# Patient Record
Sex: Female | Born: 1945 | Race: White | Hispanic: No | Marital: Married | Smoking: Never smoker
Health system: Southern US, Community
[De-identification: ages and names within clinical notes are randomized; demographics above are authoritative.]

## PROBLEM LIST (undated history)

## (undated) DIAGNOSIS — I10 Essential (primary) hypertension: Secondary | ICD-10-CM

## (undated) DIAGNOSIS — N200 Calculus of kidney: Secondary | ICD-10-CM

## (undated) DIAGNOSIS — G5600 Carpal tunnel syndrome, unspecified upper limb: Secondary | ICD-10-CM

## (undated) DIAGNOSIS — E063 Autoimmune thyroiditis: Secondary | ICD-10-CM

## (undated) DIAGNOSIS — Z87442 Personal history of urinary calculi: Secondary | ICD-10-CM

## (undated) HISTORY — PX: CHOLECYSTECTOMY: SHX55

## (undated) HISTORY — DX: Essential (primary) hypertension: I10

## (undated) HISTORY — PX: TUBAL LIGATION: SHX77

## (undated) HISTORY — DX: Carpal tunnel syndrome, unspecified upper limb: G56.00

## (undated) HISTORY — PX: TONSILLECTOMY: SUR1361

## (undated) HISTORY — DX: Autoimmune thyroiditis: E06.3

---

## 2012-09-05 HISTORY — PX: OTHER SURGICAL HISTORY: SHX169

## 2013-10-11 ENCOUNTER — Encounter: Payer: Self-pay | Admitting: Emergency Medicine

## 2013-10-11 ENCOUNTER — Emergency Department (INDEPENDENT_AMBULATORY_CARE_PROVIDER_SITE_OTHER): Payer: Medicare Other

## 2013-10-11 ENCOUNTER — Emergency Department
Admission: EM | Admit: 2013-10-11 | Discharge: 2013-10-11 | Disposition: A | Discharge status: Home / Self Care | Payer: Medicare Other | Source: Home / Self Care | Attending: Emergency Medicine | Admitting: Emergency Medicine

## 2013-10-11 DIAGNOSIS — R05 Cough: Secondary | ICD-10-CM

## 2013-10-11 DIAGNOSIS — J209 Acute bronchitis, unspecified: Secondary | ICD-10-CM

## 2013-10-11 DIAGNOSIS — R0989 Other specified symptoms and signs involving the circulatory and respiratory systems: Secondary | ICD-10-CM

## 2013-10-11 DIAGNOSIS — J0100 Acute maxillary sinusitis, unspecified: Secondary | ICD-10-CM

## 2013-10-11 DIAGNOSIS — J45909 Unspecified asthma, uncomplicated: Secondary | ICD-10-CM

## 2013-10-11 DIAGNOSIS — R062 Wheezing: Secondary | ICD-10-CM

## 2013-10-11 HISTORY — DX: Calculus of kidney: N20.0

## 2013-10-11 MED ORDER — IPRATROPIUM-ALBUTEROL 0.5-2.5 (3) MG/3ML IN SOLN
3.0000 mL | Freq: Once | RESPIRATORY_TRACT | Status: AC
  Administered 2013-10-11: 3 mL via RESPIRATORY_TRACT

## 2013-10-11 MED ORDER — AMOXICILLIN-POT CLAVULANATE 875-125 MG PO TABS: 1.0000 | ORAL_TABLET | Freq: Two times a day (BID) | ORAL | Status: DC

## 2013-10-11 MED ORDER — BENZONATATE 100 MG PO CAPS: 100.0000 mg | ORAL_CAPSULE | Freq: Three times a day (TID) | ORAL | Status: DC

## 2013-10-11 MED ORDER — METHYLPREDNISOLONE ACETATE 80 MG/ML IJ SUSP
80.0000 mg | Freq: Once | INTRAMUSCULAR | Status: AC
  Administered 2013-10-11: 80 mg via INTRAMUSCULAR

## 2013-10-11 NOTE — ED Notes (Signed)
Gives history of congestion, cough, sore throat, hoarseness. No Flu vaccination this season.

## 2013-10-11 NOTE — ED Provider Notes (Signed)
CSN: [id number]     Arrival date & time 10/11/13  1033 History   First MD Initiated Contact with Patient 10/11/13 1039     Chief Complaint  Patient presents with  . Nasal Congestion  . Cough  . Hoarse   history of congestion, cough, sore throat, hoarseness. No Flu vaccination this season.  HPI .URI HISTORY/ROS  Nina Kirby is a 67 y.o. female who complains of onset of cold symptoms for 4 days. Progressively worsening.  Have been using over-the-counter treatment which isnt helping.  No chills/sweats +  Fever  +  Nasal congestion +  Discolored Post-nasal drainage No sinus pain/pressure No sore throat  +  cough + wheezing +No chest congestion No hemoptysis + mild shortness of breath + R pleuritic pain  No itchy/red eyes No earache  No nausea No vomiting No abdominal pain No diarrhea  No skin rashes +  Fatigue No myalgias + mild headache ,no focal neuro sx's  No PMH of asthma, but son has asthma.  Past Medical History  Diagnosis Date  . Renal calculi     3 days ago   Past Surgical History  Procedure Laterality Date  . Tonsillectomy    . Cholecystectomy    . Tubal ligation     History reviewed. No pertinent family history. History  Substance Use Topics  . Smoking status: Never Smoker   . Smokeless tobacco: Not on file  . Alcohol Use: No   OB History   Grav Para Term Preterm Abortions TAB SAB Ect Mult Living                 Review of Systems  Allergies  Codeine and Zithromax  Home Medications   Current Outpatient Rx  Name  Route  Sig  Dispense  Refill  . amoxicillin-clavulanate (AUGMENTIN) 875-125 MG per tablet   Oral   Take 1 tablet by mouth 2 (two) times daily. For 10 days. Take with food.   20 tablet   0   . benzonatate (TESSALON) 100 MG capsule   Oral   Take 1 capsule (100 mg total) by mouth every 8 (eight) hours. As needed for cough   21 capsule   0    BP 173/83  Pulse 81  Temp(Src) 98.2 F (36.8 C) (Oral)  Resp 18  Ht 5\' 7"   (1.702 m)  Wt 184 lb (83.462 kg)  BMI 28.81 kg/m2  SpO2 99% Physical Exam  Nursing note and vitals reviewed. Constitutional: She is oriented to person, place, and time. She appears well-developed and well-nourished.  Non-toxic appearance. She appears ill. No distress.  HENT:  Head: Normocephalic and atraumatic.  Right Ear: Tympanic membrane normal.  Left Ear: Tympanic membrane normal.  Nose: Mucosal edema and rhinorrhea present. Right sinus exhibits maxillary sinus tenderness. Left sinus exhibits maxillary sinus tenderness.  Mouth/Throat: Oropharynx is clear and moist. No oropharyngeal exudate.  Eyes: Right eye exhibits no discharge. Left eye exhibits no discharge. No scleral icterus.  Neck: Neck supple.  Cardiovascular: Normal rate, regular rhythm and normal heart sounds.   Pulmonary/Chest: No respiratory distress. She has wheezes. She has rhonchi. She has no rales.  Lymphadenopathy:    She has no cervical adenopathy.  Neurological: She is alert and oriented to person, place, and time.  Skin: Skin is warm and dry.    ED Course  Procedures (including critical care time) Labs Review Labs Reviewed - No data to display Imaging Review CHEST 2 VIEW  COMPARISON: None.  FINDINGS:  There  is no edema or consolidation. Heart size and pulmonary  vascularity are normal. No adenopathy. There is degenerative change  in the thoracic spine.  IMPRESSION:  No edema or consolidation.  Electronically Signed  By: Bretta Bang M.D.  On: 10/11/2013 12:14   EKG Interpretation    Date/Time:    Ventricular Rate:    PR Interval:    QRS Duration:   QT Interval:    QTC Calculation:   R Axis:     Text Interpretation:              MDM   1. Acute bronchitis   2. Asthmatic bronchitis   3. Acute maxillary sinusitis    CXR: NAD. No pneumonia or ptx. Treatment options discussed. Pt agrees with the following plan : DuoNeb nebulizer treatment given. Wheezing improved. Depomedrol 80  mg IM. I advised po prednisone. Pt refused. I advised rx for albuterol MDI. Pt refused.  AUGMENTIN 875-125 MG per tablet Take 1 tablet by mouth 2 (two) times daily. For 10 days. Take with food. Benzonatate (TESSALON) 100 MG capsule Take 1 capsule (100 mg total) by mouth every 8 (eight) hours. As needed for cough  Follow up with your primary care physician or specialist if not improving, having worsening of symptoms, or new severe symptoms.  Other symptomatic care discussed. Precautions discussed. Red flags discussed. Questions invited and answered. Patient voiced understanding and agreement.    Lajean Manes, MD 10/13/13 [phone]

## 2014-01-29 ENCOUNTER — Emergency Department
Admission: EM | Admit: 2014-01-29 | Discharge: 2014-01-29 | Disposition: A | Discharge status: Home / Self Care | Payer: Medicare Other | Source: Home / Self Care | Attending: Emergency Medicine | Admitting: Emergency Medicine

## 2014-01-29 ENCOUNTER — Encounter: Payer: Self-pay | Admitting: Emergency Medicine

## 2014-01-29 DIAGNOSIS — S139XXA Sprain of joints and ligaments of unspecified parts of neck, initial encounter: Secondary | ICD-10-CM

## 2014-01-29 DIAGNOSIS — S161XXA Strain of muscle, fascia and tendon at neck level, initial encounter: Secondary | ICD-10-CM

## 2014-01-29 MED ORDER — CYCLOBENZAPRINE HCL 5 MG PO TABS: 5.0000 mg | ORAL_TABLET | Freq: Three times a day (TID) | ORAL | Status: DC | PRN

## 2014-01-29 NOTE — ED Notes (Signed)
Rt shoulder pain radiates into neck, 9/10 sharp pain, before taking excedrin, now 0
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
## 2014-06-06 ENCOUNTER — Encounter: Payer: Self-pay | Admitting: Emergency Medicine

## 2014-06-06 ENCOUNTER — Emergency Department (INDEPENDENT_AMBULATORY_CARE_PROVIDER_SITE_OTHER)
Admission: EM | Admit: 2014-06-06 | Discharge: 2014-06-06 | Disposition: A | Discharge status: Home / Self Care | Payer: Medicare Other | Source: Home / Self Care | Attending: Emergency Medicine | Admitting: Emergency Medicine

## 2014-06-06 DIAGNOSIS — B029 Zoster without complications: Secondary | ICD-10-CM

## 2014-06-06 MED ORDER — VALACYCLOVIR HCL 1 G PO TABS: ORAL_TABLET | ORAL | Status: DC

## 2014-06-06 NOTE — ED Provider Notes (Signed)
CSN: [id number]     Arrival date & time 06/06/14  0906 History   First MD Initiated Contact with Patient 06/06/14 0914     Chief Complaint  Patient presents with  . Headache    L side of face and head    HPI 5 days ago, nonspecific sharp and dull left facial/forehead/frontal pain. 5/10 intensity. Ibuprofen helps. Then onset of rash with few blisters in this area. Does not cross midline. No other ENT symptoms. No visual symptoms. No fever or chills or nausea or vomiting or chest pain or shortness of breath. Denies any focal neurologic symptoms. Noted swollen left anterior neck lymph node 2 days ago, slightly sore. Past Medical History  Diagnosis Date  . Renal calculi     3 days ago   Past Surgical History  Procedure Laterality Date  . Tonsillectomy    . Cholecystectomy    . Tubal ligation     No family history on file. History  Substance Use Topics  . Smoking status: Never Smoker   . Smokeless tobacco: Not on file  . Alcohol Use: Yes   OB History   Grav Para Term Preterm Abortions TAB SAB Ect Mult Living                 Review of Systems  All other systems reviewed and are negative.   Allergies  Codeine and Zithromax  Home Medications   Prior to Admission medications   Medication Sig Start Date End Date Taking? Authorizing Provider  ibuprofen (ADVIL,MOTRIN) 200 MG tablet Take 200 mg by mouth every 6 (six) hours as needed.   Yes Historical Provider, MD  Multiple Vitamin (MULTIVITAMIN) tablet Take 1 tablet by mouth daily.   Yes Historical Provider, MD  amoxicillin-clavulanate (AUGMENTIN) 875-125 MG per tablet Take 1 tablet by mouth 2 (two) times daily. For 10 days. Take with food. 10/11/13   Lajean Manes, MD  aspirin-acetaminophen-caffeine (EXCEDRIN MIGRAINE) [phone] MG per tablet Take by mouth every 6 (six) hours as needed for headache.    Historical Provider, MD  benzonatate (TESSALON) 100 MG capsule Take 1 capsule (100 mg total) by mouth every 8 (eight) hours.  As needed for cough 10/11/13   Lajean Manes, MD  cyclobenzaprine (FLEXERIL) 5 MG tablet Take 1 tablet (5 mg total) by mouth 3 (three) times daily as needed for muscle spasms. 01/29/14   Marlaine Hind, MD  valACYclovir (VALTREX) 1000 MG tablet Take one by mouth 3 times a day for 7 days 06/06/14   Lajean Manes, MD   BP 199/88  Pulse 85  Temp(Src) 98 F (36.7 C) (Oral)  Ht  (1.702 m)  Wt 181 lb 4 oz (82.214 kg)  BMI 28.38 kg/m2  SpO2 98% Physical Exam  Nursing note and vitals reviewed. Constitutional: She is oriented to person, place, and time. She appears well-developed and well-nourished. No distress.  HENT:  Head: Normocephalic and atraumatic.    Mouth/Throat: Oropharynx is clear and moist.  Eyes: Conjunctivae and EOM are normal. Pupils are equal, round, and reactive to light. Right eye exhibits no discharge. Left eye exhibits no discharge. No scleral icterus.  Neck: Normal range of motion.  Cardiovascular: Normal rate.   Pulmonary/Chest: Effort normal.  Abdominal: She exhibits no distension.  Musculoskeletal: Normal range of motion.  Neurological: She is alert and oriented to person, place, and time.  Skin: Skin is warm. Rash noted.  Shingles herpetiform rash left face and forehead, not involving the globe.--Does not cross the midline.  Psychiatric: She has a normal mood and affect.    ED Course  Procedures (including critical care time) Labs Review Labs Reviewed - No data to display  Imaging Review No results found.   MDM   1. Shingles     Treatment options discussed at length. I advised and offered, however she refused any prescription for pain medication and stated that ibuprofen is working well enough. Discussed option of prednisone, but she absolutely refused . Prescription for Valtrex 1000 mg 3 times a day x7 days Currently,no shingles lesions in the left eye, but because shingles affecting the left side of face,I urged her to followup with her eye  doctor within one to 2 days. An After Visit Summary was printed and given to the patient.--Information on shingles given.  Follow-up with your primary care doctor in 5 days if not improving, or sooner if symptoms become worse. Precautions discussed. Red flags discussed.--Go to emergency room if any red flags Questions invited and answered. Patient voiced understanding and agreement.   Lajean Manes, MD 06/06/14 [phone]

## 2014-06-06 NOTE — Discharge Instructions (Signed)
Shingles Shingles is caused by the same virus that causes chickenpox. The first feelings may be pain or tingling. A rash will follow in a couple days. The rash may occur on any area of the body. Long-lasting pain is more likely in an elderly person. It can last months to years. There are medicines that can help prevent pain if you start taking them early. HOME CARE   Take cool baths or place cool cloths on the rash as told by your doctor.  Take medicine only as told by your doctor.  Rest as told by your doctor.  Keep your rash clean with mild soap and cool water or as told by your doctor.  Do not scratch your rash. You may use calamine lotion to relieve itchy skin as told by your doctor.  Keep your rash covered with a loose bandage (dressing).  Avoid touching:  Babies.  Pregnant women.  Children with inflamed skin (eczema).  People who have gotten organ transplants.  People with chronic illnesses, such as leukemia or AIDS.  Wear loose-fitting clothing.  If the rash is on the face, you may need to see a specialist. Keep all appointments. Shingles must be kept away from the eyes, if possible.  Keep all follow-up visits as told by your doctor. GET HELP RIGHT AWAY IF:   You have any pain on the face or eye.  You lose feeling on one side of your face.  You have ear pain or ringing in your ear.  You cannot taste as well.  Your medicines do not help the pain.  Your redness or puffiness (swelling) spreads.  You feel like you are getting worse.  You have a fever. MAKE SURE YOU:   Understand these instructions.  Will watch your condition.  Will get help right away if you are not doing well or get worse.   Take the acyclovir  prescription as prescribed. See your eye doctor within one to 2 days. Follow-up with your primary care doctor in 5-7 days if not improving, or sooner if symptoms become worse.

## 2014-06-06 NOTE — ED Notes (Signed)
Starting last Monday with discomfort in L side of head.  States not like a regular headache.  Redness of forehead and up into hair, upper lid and into l side of neck.  Worse when pressed on.

## 2015-05-19 ENCOUNTER — Encounter: Payer: Self-pay | Admitting: *Deleted

## 2015-05-19 ENCOUNTER — Emergency Department (INDEPENDENT_AMBULATORY_CARE_PROVIDER_SITE_OTHER): Payer: Medicare Other

## 2015-05-19 ENCOUNTER — Emergency Department
Admission: EM | Admit: 2015-05-19 | Discharge: 2015-05-19 | Disposition: A | Discharge status: Home / Self Care | Payer: Medicare Other | Source: Home / Self Care | Attending: Family Medicine | Admitting: Family Medicine

## 2015-05-19 DIAGNOSIS — R05 Cough: Secondary | ICD-10-CM

## 2015-05-19 DIAGNOSIS — J209 Acute bronchitis, unspecified: Secondary | ICD-10-CM | POA: Diagnosis not present

## 2015-05-19 DIAGNOSIS — R0602 Shortness of breath: Secondary | ICD-10-CM | POA: Diagnosis not present

## 2015-05-19 DIAGNOSIS — R053 Chronic cough: Secondary | ICD-10-CM

## 2015-05-19 MED ORDER — BENZONATATE 100 MG PO CAPS: 100.0000 mg | ORAL_CAPSULE | Freq: Three times a day (TID) | ORAL | Status: DC

## 2015-05-19 MED ORDER — DOXYCYCLINE HYCLATE 100 MG PO CAPS: 100.0000 mg | ORAL_CAPSULE | Freq: Two times a day (BID) | ORAL | Status: DC

## 2015-05-19 MED ORDER — ALBUTEROL SULFATE HFA 108 (90 BASE) MCG/ACT IN AERS: 1.0000 | INHALATION_SPRAY | Freq: Four times a day (QID) | RESPIRATORY_TRACT | Status: DC | PRN

## 2015-05-19 NOTE — ED Provider Notes (Signed)
CSN: [id number]     Arrival date & time 05/19/15  1617 History   None    Chief Complaint  Patient presents with  . Cough   (Consider location/radiation/quality/duration/timing/severity/associated sxs/prior Treatment) HPI The patient is a 69 year old female presenting to urgent care with complaint of persistent productive cough and chest congestion for the last month and half.  Patient states symptoms started while she was vacationing with family in [place].  Patient states entire family is sick, however, she has had the worse.  Patient states she has tried over-the-counter Mucinex DM twice a day with minimal relief.  Patient also complained of subjective fever with night sweats and generalized fatigue.  Denies prior history of asthma or COPD but states she gets "this" at least once a year and it often leads to pleurisy but it has not gotten there yet.  States she has never had to use an inhaler.  States she has never smoked but her mother did as well as her son-in-law.  Patient states she cannot take codeine or Zithromax.   Past Medical History  Diagnosis Date  . Renal calculi     3 days ago   Past Surgical History  Procedure Laterality Date  . Tonsillectomy    . Cholecystectomy    . Tubal ligation     History reviewed. No pertinent family history. History  Substance Use Topics  . Smoking status: Never Smoker   . Smokeless tobacco: Never Used  . Alcohol Use: Yes   OB History    No data available     Review of Systems  Constitutional: Positive for fever, chills, diaphoresis and fatigue.  HENT: Positive for congestion. Negative for ear pain, sore throat, trouble swallowing and voice change.   Respiratory: Positive for cough, chest tightness, shortness of breath and wheezing.   Cardiovascular: Negative for chest pain and palpitations.  Gastrointestinal: Positive for nausea. Negative for vomiting, abdominal pain and diarrhea.  Musculoskeletal: Negative for myalgias, back pain and  arthralgias.  Skin: Negative for rash.  All other systems reviewed and are negative.   Allergies  Zithromax and Codeine  Home Medications   Prior to Admission medications   Medication Sig Start Date End Date Taking? Authorizing Provider  albuterol (PROVENTIL HFA;VENTOLIN HFA) 108 (90 BASE) MCG/ACT inhaler Inhale 1-2 puffs into the lungs every 6 (six) hours as needed for wheezing or shortness of breath. 05/19/15   Junius Finner, PA-C  benzonatate (TESSALON) 100 MG capsule Take 1 capsule (100 mg total) by mouth every 8 (eight) hours. 05/19/15   Junius Finner, PA-C  doxycycline (VIBRAMYCIN) 100 MG capsule Take 1 capsule (100 mg total) by mouth 2 (two) times daily. One po bid x 7 days 05/19/15   Junius Finner, PA-C   BP 183/85 mmHg  Pulse 103  Temp(Src) 99.8 F (37.7 C) (Oral)  Resp 22  Wt 185 lb (83.915 kg)  SpO2 93% Physical Exam  Constitutional: She appears well-developed and well-nourished. No distress.  HENT:  Head: Normocephalic and atraumatic.  Right Ear: Hearing, tympanic membrane, external ear and ear canal normal.  Left Ear: Hearing, tympanic membrane, external ear and ear canal normal.  Nose: Nose normal.  Mouth/Throat: Uvula is midline, oropharynx is clear and moist and mucous membranes are normal.  Eyes: Conjunctivae are normal. No scleral icterus.  Neck: Normal range of motion. Neck supple.  Cardiovascular: Regular rhythm and normal heart sounds.  Tachycardia present.   Mild tachycardia (pt coughing throughout exam)  Pulmonary/Chest: Effort normal. No respiratory distress. She  has wheezes. She has rales. She exhibits no tenderness.  Faint diffuse expiratory wheeze. Rhonchi in lower lung fields, intermittent productive cough throughout exam. No accessory muscle use. Able to speak in full sentences.  Abdominal: Soft. Bowel sounds are normal. She exhibits no distension and no mass. There is no tenderness. There is no rebound and no guarding.  Musculoskeletal: Normal range of  motion.  Neurological: She is alert.  Skin: Skin is warm and dry. She is not diaphoretic.  Nursing note and vitals reviewed.   ED Course  Procedures (including critical care time) Labs Review Labs Reviewed - No data to display  Imaging Review Dg Chest 2 View  05/19/2015   CLINICAL DATA:  Six weeks of productive cough and some shortness of breath, known chest pain, nonsmoker.  EXAM: CHEST  2 VIEW  COMPARISON:  PA and lateral chest x-ray of October 11, 2013  FINDINGS: The lungs are adequately inflated. The interstitial markings are coarse bilaterally but this is not entirely new. There is stable scarring in the lower retrosternal region likely on the right. There is no alveolar infiltrate. There is no pleural effusion. The heart and pulmonary vascularity are normal. The mediastinum is normal in width. There are mild multilevel degenerative disc changes of the thoracic spine.  IMPRESSION: There are chronic bronchitic changes. There is no pneumonia, CHF, nor other acute cardiopulmonary abnormality.   Electronically Signed   By: David  [place] M.D.   On: 05/19/2015 17:14     MDM   1. Acute bronchitis, unspecified organism   2. Persistent cough for 3 weeks or longer     Pt is a 69yo female c/o productive cough for 1.5 months. Became sick while in [place] with family, rest of family is also sick. Pt does have faint expiratory wheeze with rhonchi on exam. O2 Sat is 93% on RA. No prior hx of asthma or COPD. Pt states her friends have been on prednisone before, pt states she "never" wants to be on it.  Pt also states her son has asthma, his doctor stated it was the "last resort" pt hesitant to use an inhaler. CXR: significant for chronic bronchitic changes. Due to cough going on for 6 weeks, will prescribe doxycycline for acute bronchitis. Tessalon and albuterol inhaler for cough. Discussed lung findings with pt. Strongly encouraged pt to establish care with a PCP as she may need referral  to a pulmonologist due to lung findings, decreased O2 Sat at 93% on RA w/o official dx of COPD or asthma. Return precautions provided. Pt verbalized understanding and agreement with tx plan.    Junius Finner, PA-C 05/19/15 1735

## 2015-05-19 NOTE — ED Notes (Signed)
Pt c/o cough and congestion x 1 1/2 months. OTC meds without relief.

## 2016-01-03 ENCOUNTER — Ambulatory Visit (INDEPENDENT_AMBULATORY_CARE_PROVIDER_SITE_OTHER): Payer: Medicare Other | Admitting: Family Medicine

## 2016-01-03 ENCOUNTER — Encounter: Payer: Self-pay | Admitting: Family Medicine

## 2016-01-03 ENCOUNTER — Ambulatory Visit (INDEPENDENT_AMBULATORY_CARE_PROVIDER_SITE_OTHER): Payer: Medicare Other

## 2016-01-03 VITALS — BP 191/110 | HR 87 | Ht 67.0 in | Wt 191.0 lb

## 2016-01-03 DIAGNOSIS — M722 Plantar fascial fibromatosis: Secondary | ICD-10-CM | POA: Diagnosis not present

## 2016-01-03 DIAGNOSIS — I10 Essential (primary) hypertension: Secondary | ICD-10-CM

## 2016-01-03 DIAGNOSIS — M25561 Pain in right knee: Secondary | ICD-10-CM | POA: Diagnosis not present

## 2016-01-03 DIAGNOSIS — M899 Disorder of bone, unspecified: Secondary | ICD-10-CM | POA: Diagnosis not present

## 2016-01-03 DIAGNOSIS — Z8639 Personal history of other endocrine, nutritional and metabolic disease: Secondary | ICD-10-CM

## 2016-01-03 DIAGNOSIS — Z23 Encounter for immunization: Secondary | ICD-10-CM

## 2016-01-03 DIAGNOSIS — M1711 Unilateral primary osteoarthritis, right knee: Secondary | ICD-10-CM | POA: Diagnosis not present

## 2016-01-03 DIAGNOSIS — M949 Disorder of cartilage, unspecified: Secondary | ICD-10-CM

## 2016-01-03 DIAGNOSIS — M1712 Unilateral primary osteoarthritis, left knee: Secondary | ICD-10-CM

## 2016-01-03 DIAGNOSIS — E559 Vitamin D deficiency, unspecified: Secondary | ICD-10-CM | POA: Insufficient documentation

## 2016-01-03 DIAGNOSIS — E063 Autoimmune thyroiditis: Secondary | ICD-10-CM | POA: Insufficient documentation

## 2016-01-03 NOTE — Assessment & Plan Note (Addendum)
History of thyroiditis. Check TSH free T3 and free T4

## 2016-01-03 NOTE — Progress Notes (Signed)
   Nina Kirby is a 70 y.o. female who presents to [facility] today for stab was care discuss foot and knee pain hypertension and disinterest in health screening.  1) hypertension: Lifelong. Patient is reluctant to take medications for this problem. She states her blood pressures typically pretty well controlled at home but can't remember any specific numbers. No chest pains palpitations or shortness of breath.  2) right knee pain: Present for 3-4 months. Patient notes generalized knee pain with some swelling. She notes she has difficulty extending her knee fully. She denies any specific injury. She notes her pain is improving overall over the last several months with conservative management.  3) right foot pain: Patient developed right plantar calcaneal pain. She thought this was related to plantar fasciitis and use some over-the-counter braces and foot pads which have helped a lot. She notes the pain is improving but is still moderate. Pain is worse with activity.  4) health screening: Patient is not interested in breast or colon cancer screening. She understands the risk and is willing to die if she develops these problems. Additionally she is already had shingles and is not interested in the shingles vaccination. She is interested and Tdap vaccine today. She states that she started had both pneumonia shots at [facility] about a year ago. She does not get influenza vaccines.   Past Medical History  Diagnosis Date  . Renal calculi     3 days ago   Past Surgical History  Procedure Laterality Date  . Tonsillectomy    . Cholecystectomy    . Tubal ligation     Social History  Substance Use Topics  . Smoking status: Never Smoker   . Smokeless tobacco: Never Used  . Alcohol Use: Yes   family history is not on file.  ROS:  No headache, visual changes, nausea, vomiting, diarrhea, constipation, dizziness, abdominal pain, skin rash, fevers, chills,  night sweats, weight loss, swollen lymph nodes, body aches, joint swelling, muscle aches, chest pain, shortness of breath, mood changes, visual or auditory hallucinations.    Medications: Current Outpatient Prescriptions  Medication Sig Dispense Refill  . Acetaminophen (TYLENOL 8 HOUR PO) Take by mouth.    . IBUPROFEN PO Take by mouth.     No current facility-administered medications for this visit.   Allergies  Allergen Reactions  . Zithromax [Azithromycin] Swelling    Oral swelling  . Codeine Other (See Comments)    Jitters      Exam:  BP 191/110 mmHg  Pulse 87  Ht 5\' 7"  (1.702 m)  Wt 191 lb (86.637 kg)  BMI 29.91 kg/m2 General: Well Developed, well nourished, and in no acute distress.  Neuro/Psych: Alert and oriented x3, extra-ocular muscles intact, able to move all 4 extremities, sensation grossly intact. Skin: Warm and dry, no rashes noted.  Respiratory: Not using accessory muscles, speaking in full sentences, trachea midline.  Cardiovascular: Pulses palpable, no extremity edema. Abdomen: Does not appear distended. MSK: Right knee: Normal-appearing no effusion Range of motion 5-120 with 1+ right patellar crepitations. Nontender. Stable ligamentous exam Positive medial McMurray's test.  Foot normal-appearing. Tender palpation plantar calcaneus. This is capillary refill and sensation intact. Mild antalgic gait.  Patient was given Tdap vaccine prior to discharge   No results found for this or any previous visit (from the past 24 hour(s)). No results found.   Please see individual assessment and plan sections.

## 2016-01-03 NOTE — Assessment & Plan Note (Signed)
Likely DJD. X-ray pending. Recheck in one month.

## 2016-01-03 NOTE — Assessment & Plan Note (Signed)
Plantar fasciitis doing well. Continue current management

## 2016-01-03 NOTE — Patient Instructions (Signed)
Thank you for coming in today. Get fasting labs.  Return in 1 month.  Bring your home blood pressure cuff.  Get xray today.   Hypertension Hypertension, commonly called high blood pressure, is when the force of blood pumping through your arteries is too strong. Your arteries are the blood vessels that carry blood from your heart throughout your body. A blood pressure reading consists of a higher number over a lower number, such as 110/72. The higher number (systolic) is the pressure inside your arteries when your heart pumps. The lower number (diastolic) is the pressure inside your arteries when your heart relaxes. Ideally you want your blood pressure below 120/80. Hypertension forces your heart to work harder to pump blood. Your arteries may become narrow or stiff. Having untreated or uncontrolled hypertension can cause heart attack, stroke, kidney disease, and other problems. RISK FACTORS Some risk factors for high blood pressure are controllable. Others are not.  Risk factors you cannot control include:   Race. You may be at higher risk if you are African American.  Age. Risk increases with age.  Gender. Men are at higher risk than women before age 72 years. After age 72, women are at higher risk than men. Risk factors you can control include:  Not getting enough exercise or physical activity.  Being overweight.  Getting too much fat, sugar, calories, or salt in your diet.  Drinking too much alcohol. SIGNS AND SYMPTOMS Hypertension does not usually cause signs or symptoms. Extremely high blood pressure (hypertensive crisis) may cause headache, anxiety, shortness of breath, and nosebleed. DIAGNOSIS To check if you have hypertension, your health care provider will measure your blood pressure while you are seated, with your arm held at the level of your heart. It should be measured at least twice using the same arm. Certain conditions can cause a difference in blood pressure between  your right and left arms. A blood pressure reading that is higher than normal on one occasion does not mean that you need treatment. If it is not clear whether you have high blood pressure, you may be asked to return on a different day to have your blood pressure checked again. Or, you may be asked to monitor your blood pressure at home for 1 or more weeks. TREATMENT Treating high blood pressure includes making lifestyle changes and possibly taking medicine. Living a healthy lifestyle can help lower high blood pressure. You may need to change some of your habits. Lifestyle changes may include:  Following the DASH diet. This diet is high in fruits, vegetables, and whole grains. It is low in salt, red meat, and added sugars.  Keep your sodium intake below 2,300 mg per day.  Getting at least 30-45 minutes of aerobic exercise at least 4 times per week.  Losing weight if necessary.  Not smoking.  Limiting alcoholic beverages.  Learning ways to reduce stress. Your health care provider may prescribe medicine if lifestyle changes are not enough to get your blood pressure under control, and if one of the following is true:  You are 1718-72 years of age and your systolic blood pressure is above 140.  You are 72 years of age or older, and your systolic blood pressure is above 150.  Your diastolic blood pressure is above 90.  You have diabetes, and your systolic blood pressure is over 140 or your diastolic blood pressure is over 90.  You have kidney disease and your blood pressure is above 140/90.  You have heart disease  and your blood pressure is above 140/90. Your personal target blood pressure may vary depending on your medical conditions, your age, and other factors. HOME CARE INSTRUCTIONS  Have your blood pressure rechecked as directed by your health care provider.   Take medicines only as directed by your health care provider. Follow the directions carefully. Blood pressure medicines  must be taken as prescribed. The medicine does not work as well when you skip doses. Skipping doses also puts you at risk for problems.  Do not smoke.   Monitor your blood pressure at home as directed by your health care provider. SEEK MEDICAL CARE IF:   You think you are having a reaction to medicines taken.  You have recurrent headaches or feel dizzy.  You have swelling in your ankles.  You have trouble with your vision. SEEK IMMEDIATE MEDICAL CARE IF:  You develop a severe headache or confusion.  You have unusual weakness, numbness, or feel faint.  You have severe chest or abdominal pain.  You vomit repeatedly.  You have trouble breathing. MAKE SURE YOU:   Understand these instructions.  Will watch your condition.  Will get help right away if you are not doing well or get worse.   This information is not intended to replace advice given to you by your health care provider. Make sure you discuss any questions you have with your health care provider.   Document Released: 10/02/2005 Document Revised: 02/16/2015 Document Reviewed: 07/25/2013 Elsevier Interactive Patient Education [employer]

## 2016-01-03 NOTE — Assessment & Plan Note (Signed)
Poorly controlled. Patient is not interested in medicines at this time. I advised weight loss and decrease salt intake. Obtain fasting labs. Recheck in one month

## 2016-01-04 NOTE — Progress Notes (Signed)
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 ______

## 2016-01-05 LAB — COMPREHENSIVE METABOLIC PANEL
ALK PHOS: 101 U/L (ref 33–130)
ALT: 21 U/L (ref 6–29)
AST: 18 U/L (ref 10–35)
Albumin: 4.4 g/dL (ref 3.6–5.1)
BILIRUBIN TOTAL: 0.4 mg/dL (ref 0.2–1.2)
BUN: 16 mg/dL (ref 7–25)
CO2: 22 mmol/L (ref 20–31)
Calcium: 10.2 mg/dL (ref 8.6–10.4)
Chloride: 108 mmol/L (ref 98–110)
Creat: 0.9 mg/dL (ref 0.60–0.93)
Glucose, Bld: 106 mg/dL — ABNORMAL HIGH (ref 65–99)
POTASSIUM: 4.4 mmol/L (ref 3.5–5.3)
Sodium: 142 mmol/L (ref 135–146)
Total Protein: 7.6 g/dL (ref 6.1–8.1)

## 2016-01-05 LAB — CBC
HCT: 37.8 % (ref 36.0–46.0)
Hemoglobin: 12.7 g/dL (ref 12.0–15.0)
MCH: 31.4 pg (ref 26.0–34.0)
MCHC: 33.6 g/dL (ref 30.0–36.0)
MCV: 93.6 fL (ref 78.0–100.0)
MPV: 9.6 fL (ref 8.6–12.4)
PLATELETS: 241 10*3/uL (ref 150–400)
RBC: 4.04 MIL/uL (ref 3.87–5.11)
RDW: 13.9 % (ref 11.5–15.5)
WBC: 4.5 10*3/uL (ref 4.0–10.5)

## 2016-01-05 LAB — LIPID PANEL
CHOL/HDL RATIO: 5.5 ratio — AB (ref <=5.0)
Cholesterol: 193 mg/dL (ref 125–200)
HDL: 35 mg/dL — ABNORMAL LOW (ref >=46)
LDL Cholesterol: 99 mg/dL (ref <130)
Triglycerides: 297 mg/dL — ABNORMAL HIGH (ref <150)
VLDL: 59 mg/dL — ABNORMAL HIGH (ref <30)

## 2016-01-05 LAB — T4, FREE: Free T4: 0.9 ng/dL (ref 0.8–1.8)

## 2016-01-05 LAB — T3, FREE: T3 FREE: 3 pg/mL (ref 2.3–4.2)

## 2016-01-05 LAB — VITAMIN B12: Vitamin B-12: 488 pg/mL (ref 200–1100)

## 2016-01-05 LAB — TSH: TSH: 4.85 mIU/L — ABNORMAL HIGH

## 2016-01-06 ENCOUNTER — Encounter: Payer: Self-pay | Admitting: Family Medicine

## 2016-01-06 DIAGNOSIS — R7303 Prediabetes: Secondary | ICD-10-CM | POA: Insufficient documentation

## 2016-01-06 DIAGNOSIS — R7989 Other specified abnormal findings of blood chemistry: Secondary | ICD-10-CM | POA: Insufficient documentation

## 2016-01-06 DIAGNOSIS — E785 Hyperlipidemia, unspecified: Secondary | ICD-10-CM | POA: Insufficient documentation

## 2016-01-06 LAB — HEMOGLOBIN A1C
Hgb A1c MFr Bld: 6 % — ABNORMAL HIGH (ref <5.7)
Mean Plasma Glucose: 126 mg/dL — ABNORMAL HIGH (ref <117)

## 2016-01-06 LAB — VITAMIN D 25 HYDROXY (VIT D DEFICIENCY, FRACTURES): Vit D, 25-Hydroxy: 22 ng/mL — ABNORMAL LOW (ref 30–100)

## 2016-01-06 NOTE — Progress Notes (Signed)
Quick Note:  1) Vitamin D deficiency noted. Take 2000 units of vitamin D daily over-the-counter.  2) Prediabetes: Work on weight loss and low carbohydrate diet.  3) Cholesterol is bad. I would like to start cholesterol medicines.  4) Thyroid labs is very mildly abnormal. We will recheck in a few months. ______

## 2016-01-07 ENCOUNTER — Encounter: Payer: Self-pay | Admitting: Family Medicine

## 2016-02-03 ENCOUNTER — Ambulatory Visit (INDEPENDENT_AMBULATORY_CARE_PROVIDER_SITE_OTHER): Payer: Medicare Other | Admitting: Family Medicine

## 2016-02-03 ENCOUNTER — Encounter: Payer: Self-pay | Admitting: Family Medicine

## 2016-02-03 VITALS — BP 198/84 | HR 75 | Wt 188.0 lb

## 2016-02-03 DIAGNOSIS — I10 Essential (primary) hypertension: Secondary | ICD-10-CM | POA: Diagnosis not present

## 2016-02-03 DIAGNOSIS — M25561 Pain in right knee: Secondary | ICD-10-CM | POA: Diagnosis not present

## 2016-02-03 NOTE — Assessment & Plan Note (Signed)
Patient is adamant in her decision to not take medications or pursue lifestyle modification for blood pressure management. We will honor patient's autonomy and decision-making and will continue to encourage her to take this potential serious and life-threatening medical problem seriously in future visits.

## 2016-02-03 NOTE — Patient Instructions (Signed)
Thank you for coming in today. Call or go to the ER if you develop a large red swollen joint with extreme pain or oozing puss.  Return in 1 month or so.  I do recommend blood pressure medicine.

## 2016-02-03 NOTE — Assessment & Plan Note (Signed)
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 Injection today. Recheck in a month or so.

## 2016-02-03 NOTE — Progress Notes (Signed)
       Nina Kirby is a 70 y.o. female who [facility] today for follow-up hypertension and knee pain.  1) hypertension: Patient continues to have elevated blood pressure. She did not start lifestyle modifications. She is not interested at all and blood pressure management. She denies any chest pains palpitations or shortness of breath.  2) right knee pain. Patient notes chronic right knee pain. She notes difficulty extending her knee when she walks. She notes continued knee pain but now calf pain and anterior thigh pain. She denies any claudication symptoms such as cramping pain in her calf that improves immediately with rest. She denies any recent injury.   Past Medical History  Diagnosis Date  . Renal calculi     3 days ago   Past Surgical History  Procedure Laterality Date  . Tonsillectomy    . Cholecystectomy    . Tubal ligation    . Normal stess test  09/05/12    Normal Nuclear Stress test. See scanned document   Social History  Substance Use Topics  . Smoking status: Never Smoker   . Smokeless tobacco: Never Used  . Alcohol Use: Yes   family history is not on file.  ROS as above Medications: Current Outpatient Prescriptions  Medication Sig Dispense Refill  . Acetaminophen (TYLENOL 8 HOUR PO) Take by mouth.    . IBUPROFEN PO Take by mouth.     No current facility-administered medications for this visit.   Allergies  Allergen Reactions  . Zithromax [Azithromycin] Swelling    Oral swelling  . Codeine Other (See Comments)    Jitters      Exam:  BP 198/84 mmHg  Pulse 75  Wt 188 lb (85.276 kg) Gen: Well NAD HEENT: EOMI,  MMM Lungs: Normal work of breathing. CTABL Heart: RRR no MRG Abd: NABS, Soft. Nondistended, Nontender Exts: Brisk capillary refill, warm and well perfused.  Right knee: Mild effusion. Range of motion 5-120 with 1+  retropatellar crepitations. Nontender. Stable ligamentous exam. Gait: Antalgic. Patient cannot extend her right knee fully with gait resulting in a limp.  X-ray right knee dated 01/03/2016 reviewed showing DJD   Procedure: Real-time Ultrasound Guided Injection of right knee  Device: GE Logiq E  Images permanently stored and available for review in the ultrasound unit. Verbal informed consent obtained. Discussed risks and benefits of procedure. Warned about infection bleeding damage to structures skin hypopigmentation and fat atrophy among others. Patient expresses understanding and agreement Time-out conducted.  Noted no overlying erythema, induration, or other signs of local infection.  Skin prepped in a sterile fashion.  Local anesthesia: Topical Ethyl chloride.  With sterile technique and under real time ultrasound guidance: 80 mg of Kenalog and 4 mL of Marcaine injected easily.  Completed without difficulty  Pain immediately resolved suggesting accurate placement of the medication.  Advised to call if fevers/chills, erythema, induration, drainage, or persistent bleeding.  Images permanently stored and available for review in the ultrasound unit.  Impression: Technically successful ultrasound guided injection.     No results found for this or any previous visit (from the past 24 hour(s)). No results found.   Please see individual assessment and plan sections.

## 2016-03-02 ENCOUNTER — Ambulatory Visit (INDEPENDENT_AMBULATORY_CARE_PROVIDER_SITE_OTHER): Payer: Medicare Other | Admitting: Family Medicine

## 2016-03-02 ENCOUNTER — Encounter: Payer: Self-pay | Admitting: Family Medicine

## 2016-03-02 VITALS — BP 197/94 | HR 75 | Wt 187.0 lb

## 2016-03-02 DIAGNOSIS — M25561 Pain in right knee: Secondary | ICD-10-CM

## 2016-03-02 DIAGNOSIS — I10 Essential (primary) hypertension: Secondary | ICD-10-CM | POA: Diagnosis not present

## 2016-03-02 NOTE — Assessment & Plan Note (Signed)
Respect patient autonomy and decision-making. Continue to work on this issue. Recheck in about 6 months.

## 2016-03-02 NOTE — Progress Notes (Signed)
       Nina KitchensShirley Kirby is a 70 y.o. female who [name] today for follow-up knee pain and hypertension.  1) knee pain: Status post steroid injection about a month ago. Much improved. She is essentially pain-free with normal activities. She continues to be correct crepitations on activity. She is satisfied with how things are going. She uses ibuprofen intermittently.  2) protection: Patient remains very resistant to taking any medications or pursuing last modification for her hypertension. She understands that she is at risk for heart failure kidney failure and stroke heart attack and death etc.  3) health maintenance: Patient is not interested in pursuing further health maintenance items.   Past Medical History  Diagnosis Date  . Renal calculi     3 days ago   Past Surgical History  Procedure Laterality Date  . Tonsillectomy    . Cholecystectomy    . Tubal ligation    . Normal stess test  09/05/12    Normal Nuclear Stress test. See scanned document   Social History  Substance Use Topics  . Smoking status: Never Smoker   . Smokeless tobacco: Never Used  . Alcohol Use: Yes   family history is not on file.  ROS as above Medications: Current Outpatient Prescriptions  Medication Sig Dispense Refill  . Acetaminophen (TYLENOL 8 HOUR PO) Take by mouth.    . IBUPROFEN PO Take by mouth.     No current facility-administered medications for this visit.   Allergies  Allergen Reactions  . Zithromax [Azithromycin] Swelling    Oral swelling  . Codeine Other (See Comments)    Jitters      Exam:  BP 197/94 mmHg  Pulse 75  Wt 187 lb (84.823 kg) Gen: Well NAD HEENT: EOMI,  MMM Lungs: Normal work of breathing. CTABL Heart: RRR no MRG Abd: NABS, Soft. Nondistended, Nontender Exts: Brisk capillary refill, warm and well perfused.  Right knee normal-appearing normal  gait.  No results found for this or any previous visit (from the past 24 hour(s)). No results found.   Please see individual assessment and plan sections.

## 2016-03-02 NOTE — Patient Instructions (Signed)
Thank you for coming in today. Return in 6 months or sooner if needed.  Consider the Gel shots for knee pain as needed.  [facility] which [facility].

## 2016-03-02 NOTE — Assessment & Plan Note (Signed)
Doing well. Continue current regimen. Serial steroid injections as needed every 3 months. Consider Visco supplementation.

## 2016-04-20 ENCOUNTER — Encounter: Payer: Self-pay | Admitting: Emergency Medicine

## 2016-04-20 ENCOUNTER — Emergency Department
Admission: EM | Admit: 2016-04-20 | Discharge: 2016-04-20 | Disposition: A | Discharge status: Home / Self Care | Payer: Medicare Other | Source: Home / Self Care | Attending: Emergency Medicine | Admitting: Emergency Medicine

## 2016-04-20 DIAGNOSIS — N200 Calculus of kidney: Secondary | ICD-10-CM | POA: Diagnosis not present

## 2016-04-20 HISTORY — DX: Personal history of urinary calculi: Z87.442

## 2016-04-20 LAB — POCT URINALYSIS DIP (MANUAL ENTRY)
Bilirubin, UA: NEGATIVE
Glucose, UA: NEGATIVE
Ketones, POC UA: NEGATIVE
Leukocytes, UA: NEGATIVE
Nitrite, UA: NEGATIVE
Protein Ur, POC: 30 — AB
Spec Grav, UA: >=1.03 (ref 1.005–1.03)
Urobilinogen, UA: 0.2 (ref 0–1)
pH, UA: 5.5 (ref 5–8)

## 2016-04-20 MED ORDER — TRAMADOL-ACETAMINOPHEN 37.5-325 MG PO TABS: 1.0000 | ORAL_TABLET | Freq: Four times a day (QID) | ORAL | Status: DC | PRN

## 2016-04-20 MED ORDER — TAMSULOSIN HCL 0.4 MG PO CAPS: 0.4000 mg | ORAL_CAPSULE | Freq: Every day | ORAL | Status: DC

## 2016-04-20 NOTE — ED Provider Notes (Signed)
CSN: [id number]     Arrival date & time 04/20/16  1033 History   First MD Initiated Contact with Patient 04/20/16 1048     Chief Complaint  Patient presents with  . Abdominal Pain   (Consider location/radiation/quality/duration/timing/severity/associated sxs/prior Treatment) HPI Patient was feeling normal this morning, until about 2 hours prior to coming to urgent care, when she developed severe acute right lower quadrant and right flank and right CVA pain, without any further radiation. Unrelated to position. Recalls no injury. The pain resolved on its own 1-1/2 hours later. She does not recall any urinary symptoms and does not recall passing a kidney stone. Denies gross hematuria or dysuria or frequency. Currently denies any pain. No abdominal or back pain. This morning, had some nausea, which has completely resolved. No vomiting or diarrhea. No chest pain or shortness of breath.  She states these symptoms were similar to a kidney stone which she passed years ago. She does not recall having workup for renal calculi in the past, but her medical record indicates history of right renal calculi. Past Medical History  Diagnosis Date  . Renal calculi     3 days ago  . H/O renal calculi     right   Past Surgical History  Procedure Laterality Date  . Tonsillectomy    . Cholecystectomy    . Tubal ligation    . Normal stess test  09/05/12    Normal Nuclear Stress test. See scanned document   History reviewed. No pertinent family history. Social History  Substance Use Topics  . Smoking status: Never Smoker   . Smokeless tobacco: Never Used  . Alcohol Use: Yes   OB History    No data available     Review of Systems  All other systems reviewed and are negative.   Allergies  Zithromax and Codeine  Home Medications   Prior to Admission medications   Medication Sig Start Date End Date Taking? Authorizing Provider  Acetaminophen (TYLENOL 8 HOUR PO) Take by mouth.    Historical  Provider, MD  IBUPROFEN PO Take by mouth.    Historical Provider, MD  tamsulosin (FLOMAX) 0.4 MG CAPS capsule Take 1 capsule (0.4 mg total) by mouth daily. With food. To help pass kidney stone 04/20/16   Lajean Manesavid Massey, MD  traMADol-acetaminophen (ULTRACET) 37.5-325 MG tablet Take 1-2 tablets by mouth every 6 (six) hours as needed. For severe pain 04/20/16   Lajean Manesavid Massey, MD   Meds Ordered and Administered this Visit  Medications - No data to display  BP 185/82 mmHg  Pulse 72  Temp(Src) 97.7 F (36.5 C) (Oral)  Resp 16  Ht 5\' 7"  (1.702 m)  Wt 175 lb (79.379 kg)  BMI 27.40 kg/m2  SpO2 95% No data found.   Physical Exam  Constitutional: She is oriented to person, place, and time. She appears well-developed and well-nourished. No distress.  HENT:  Head: Normocephalic and atraumatic.  Eyes: Conjunctivae and EOM are normal. Pupils are equal, round, and reactive to light. No scleral icterus.  Neck: Normal range of motion.  Cardiovascular: Normal rate.   Pulmonary/Chest: Effort normal.  Abdominal: Soft. Bowel sounds are normal. She exhibits no distension and no mass. There is no tenderness. There is no rebound and no guarding.  No CVA tenderness  Musculoskeletal: Normal range of motion.  Neurological: She is alert and oriented to person, place, and time.  Skin: Skin is warm. No rash noted.  Psychiatric: She has a normal mood and affect.  Nursing note and vitals reviewed.   ED Course  Procedures (including critical care time)  Labs Review Labs Reviewed  POCT URINALYSIS DIP (MANUAL ENTRY) - Abnormal; Notable for the following:    Clarity, UA cloudy (*)    Blood, UA large (*)    Protein Ur, POC =30 (*)    All other components within normal limits  URINE CULTURE    Imaging Review No results found.    MDM   1. Right kidney stone   History and physical exam and hematuria on UA are all consistent with right ureteral calculus. Her symptoms only lasted 1-1/2 hours this morning and  have completely resolved and physical exam normal now. Clinically, no sign of intra-abdominal process or any signs of surgical abdomen.  I advised and offered a CBC. She declined any further blood workup or any imaging. She prefers to treat conservatively. Push fluids and other advice given. Urine strainer given, advised to strain urines An After Visit Summary was printed and given to the patient. Red flags discussed. Go to emergency room if any red flag. Although she has no pain currently, gave her written prescription for Ultracet in case she has recurrence of pain. She voiced understanding and agreement. New Prescriptions   TAMSULOSIN (FLOMAX) 0.4 MG CAPS CAPSULE    Take 1 capsule (0.4 mg total) by mouth daily. With food. To help pass kidney stone   TRAMADOL-ACETAMINOPHEN (ULTRACET) 37.5-325 MG TABLET    Take 1-2 tablets by mouth every 6 (six) hours as needed. For severe pain   Urine culture sent  Lajean Manesavid Massey, MD 04/20/16 [phone]

## 2016-04-20 NOTE — Discharge Instructions (Signed)
Kidney Stones °Kidney stones (urolithiasis) are deposits that form inside your kidneys. The intense pain is caused by the stone moving through the urinary tract. When the stone moves, the ureter goes into spasm around the stone. The stone is usually passed in the urine.  °CAUSES  °· A disorder that makes certain neck glands produce too much parathyroid hormone (primary hyperparathyroidism). °· A buildup of uric acid crystals, similar to gout in your joints. °· Narrowing (stricture) of the ureter. °· A kidney obstruction present at birth (congenital obstruction). °· Previous surgery on the kidney or ureters. °· Numerous kidney infections. °SYMPTOMS  °· Feeling sick to your stomach (nauseous). °· Throwing up (vomiting). °· Blood in the urine (hematuria). °· Pain that usually spreads (radiates) to the groin. °· Frequency or urgency of urination. °DIAGNOSIS  °· Taking a history and physical exam. °· Blood or urine tests. °· CT scan. °· Occasionally, an examination of the inside of the urinary bladder (cystoscopy) is performed. °TREATMENT  °· Observation. °· Increasing your fluid intake. °· Extracorporeal shock wave lithotripsy--This is a noninvasive procedure that uses shock waves to break up kidney stones. °· Surgery may be needed if you have severe pain or persistent obstruction. There are various surgical procedures. Most of the procedures are performed with the use of small instruments. Only small incisions are needed to accommodate these instruments, so recovery time is minimized. °The size, location, and chemical composition are all important variables that will determine the proper choice of action for you. Talk to your health care provider to better understand your situation so that you will minimize the risk of injury to yourself and your kidney.  °HOME CARE INSTRUCTIONS  °· Drink enough water and fluids to keep your urine clear or pale yellow. This will help you to pass the stone or stone fragments. °· Strain  all urine through the provided strainer. Keep all particulate matter and stones for your health care provider to see. The stone causing the pain may be as small as a grain of salt. It is very important to use the strainer each and every time you pass your urine. The collection of your stone will allow your health care provider to analyze it and verify that a stone has actually passed. The stone analysis will often identify what you can do to reduce the incidence of recurrences. °· Only take over-the-counter or prescription medicines for pain, discomfort, or fever as directed by your health care provider. °· Keep all follow-up visits as told by your health care provider. This is important. °· Get follow-up X-rays if required. The absence of pain does not always mean that the stone has passed. It may have only stopped moving. If the urine remains completely obstructed, it can cause loss of kidney function or even complete destruction of the kidney. It is your responsibility to make sure X-rays and follow-ups are completed. Ultrasounds of the kidney can show blockages and the status of the kidney. Ultrasounds are not associated with any radiation and can be performed easily in a matter of minutes. °· Make changes to your daily diet as told by your health care provider. You may be told to: °¨ Limit the amount of salt that you eat. °¨ Eat 5 or more servings of fruits and vegetables each day. °¨ Limit the amount of meat, poultry, fish, and eggs that you eat. °· Collect a 24-hour urine sample as told by your health care provider. You may need to collect another urine sample every 6-12   months. °SEEK MEDICAL CARE IF: °· You experience pain that is progressive and unresponsive to any pain medicine you have been prescribed. °SEEK IMMEDIATE MEDICAL CARE IF:  °· Pain cannot be controlled with the prescribed medicine. °· You have a fever or shaking chills. °· The severity or intensity of pain increases over 18 hours and is not  relieved by pain medicine. °· You develop a new onset of abdominal pain. °· You feel faint or pass out. °· You are unable to urinate. °  °This information is not intended to replace advice given to you by your health care provider. Make sure you discuss any questions you have with your health care provider. °  °Document Released: 10/02/2005 Document Revised: 06/23/2015 Document Reviewed: 03/05/2013 °Elsevier Interactive Patient Education ©2016 [employer] ° °

## 2016-04-20 NOTE — ED Notes (Signed)
Patient reports sudden onset right lower quadrant pain, which radiates to ischeal area, about 1 1/2 hours ago; intermittent and causes nausea.

## 2016-04-22 ENCOUNTER — Telehealth: Payer: Self-pay | Admitting: Emergency Medicine

## 2016-04-22 LAB — URINE CULTURE: Colony Count: 6000

## 2016-04-23 ENCOUNTER — Telehealth: Payer: Self-pay | Admitting: Emergency Medicine

## 2016-04-23 NOTE — ED Notes (Signed)
PT INFORMED OF NEGATIVE URINE CULTURE RESULTS.

## 2020-12-11 ENCOUNTER — Other Ambulatory Visit: Payer: Self-pay

## 2020-12-11 ENCOUNTER — Emergency Department (INDEPENDENT_AMBULATORY_CARE_PROVIDER_SITE_OTHER): Payer: Medicare Other

## 2020-12-11 ENCOUNTER — Encounter: Payer: Self-pay | Admitting: Emergency Medicine

## 2020-12-11 ENCOUNTER — Emergency Department
Admission: EM | Admit: 2020-12-11 | Discharge: 2020-12-11 | Disposition: A | Discharge status: Home / Self Care | Payer: Medicare Other | Source: Home / Self Care | Attending: Family Medicine | Admitting: Family Medicine

## 2020-12-11 DIAGNOSIS — I10 Essential (primary) hypertension: Secondary | ICD-10-CM

## 2020-12-11 DIAGNOSIS — R5383 Other fatigue: Secondary | ICD-10-CM | POA: Diagnosis not present

## 2020-12-11 MED ORDER — LISINOPRIL-HYDROCHLOROTHIAZIDE 10-12.5 MG PO TABS: 1.0000 | ORAL_TABLET | Freq: Every day | ORAL | 1 refills | Status: DC

## 2020-12-11 NOTE — Discharge Instructions (Signed)
You have high blood pressure. To prevent complications, I recommend you take medication once a day You need to see a primary care doctor on a regular basis If you feel worse instead of better, have headache, chest pain, dizziness or confusion, go straight to the emergency room

## 2020-12-11 NOTE — ED Provider Notes (Signed)
[name]    CSN: [id number] Arrival date & time: 12/11/20  7124      History   Chief Complaint Chief Complaint  Patient presents with  . Elevated blood pressure    HPI Nina Kirby is a 75 y.o. female.   HPI   Patient states she has not been to her medical doctor for years.  I did review the chart and saw some notes from 2017.  At that time she was done with hypertension and was encouraged to go on blood pressure medication.  She refused blood pressure medication and states she wanted to make lifestyle changes.  She never went back for follow-up.  The doctor documented that he lectured her about her risk of untreated hypertension placing her at increased risk of heart disease, stroke, kidney failure.  It is now 5 years later and she does not feel well, she is here because her blood pressure is up.  She took her blood pressure with her husband's cuff last night and it was 195/116.  Her symptoms are very vague.  Also states that she is tired.  No headache.  No dizzy spell.  No visual changes.  No nausea or vomiting.  No abdominal pain.  No chest pain.  No shortness of breath.  No pedal edema. She is not on any medications She has had no preventative medicine or immunizations except the COVID shots She does not remember the last time she had a physical exam or blood work  Past Medical History:  Diagnosis Date  . H/O renal calculi    right  . Renal calculi    3 days ago    Patient Active Problem List   Diagnosis Date Noted  . Hyperlipemia 01/06/2016  . Prediabetes 01/06/2016  . Elevated TSH 01/06/2016  . HTN (hypertension) 01/03/2016  . Right knee pain 01/03/2016  . Plantar fasciitis of right foot 01/03/2016  . Vitamin D deficiency 01/03/2016  . Hashimoto's thyroiditis 01/03/2016    Past Surgical History:  Procedure Laterality Date  . CHOLECYSTECTOMY    . Normal Stess test  09/05/12   Normal Nuclear Stress test. See scanned document  . TONSILLECTOMY     . TUBAL LIGATION      OB History   No obstetric history on file.      Home Medications    Prior to Admission medications   Medication Sig Start Date End Date Taking? Authorizing Provider  Acetaminophen (TYLENOL 8 HOUR PO) Take by mouth.   Yes [provider]  IBUPROFEN PO Take by mouth.   Yes [provider]  lisinopril-hydrochlorothiazide (ZESTORETIC) 10-12.5 MG tablet Take 1 tablet by mouth daily. 12/11/20  Yes Eustace Moore, MD    Family History No family history on file.  Social History Social History   Tobacco Use  . Smoking status: Never Smoker  . Smokeless tobacco: Never Used  Substance Use Topics  . Alcohol use: Yes  . Drug use: No     Allergies   Zithromax [azithromycin] and Codeine   Review of Systems Review of Systems  See HPI Physical Exam Triage Vital Signs ED Triage Vitals  Enc Vitals Group     BP 12/11/20 0958 (!) 234/118     Pulse Rate 12/11/20 0958 99     Resp --      Temp --      Temp src --      SpO2 12/11/20 0958 95 %     Weight --  Height --      Head Circumference --      Peak Flow --      Pain Score 12/11/20 0959 0     Pain Loc --      Pain Edu? --      Excl. in GC? --    No data found.  Updated Vital Signs BP (!) 233/127 (BP Location: Right Arm)   Pulse 99   SpO2 95%   Physical Exam Constitutional:      General: She is not in acute distress.    Appearance: She is well-developed and well-nourished.     Comments: Pleasant.  Mild overweight.  HENT:     Head: Normocephalic and atraumatic.     Right Ear: Tympanic membrane, ear canal and external ear normal.     Left Ear: Tympanic membrane, ear canal and external ear normal.     Nose: Nose normal.     Mouth/Throat:     Mouth: Oropharynx is clear and moist. Mucous membranes are moist.     Pharynx: No posterior oropharyngeal erythema.  Eyes:     Conjunctiva/sclera: Conjunctivae normal.     Pupils: Pupils are equal, round, and reactive to  light.     Comments: Disks are flat.  AV nicking.  No hemorrhage  Neck:     Vascular: No carotid bruit.  Cardiovascular:     Rate and Rhythm: Normal rate and regular rhythm.     Heart sounds: Normal heart sounds.  Pulmonary:     Effort: Pulmonary effort is normal. No respiratory distress.     Breath sounds: Normal breath sounds.  Abdominal:     General: Abdomen is flat. There is no distension.     Palpations: Abdomen is soft.     Tenderness: There is no abdominal tenderness.     Comments: No mass or organomegaly  Musculoskeletal:        General: No edema. Normal range of motion.     Cervical back: Normal range of motion and neck supple.     Right lower leg: No edema.     Left lower leg: No edema.  Skin:    General: Skin is warm and dry.  Neurological:     General: No focal deficit present.     Mental Status: She is alert.  Psychiatric:     Comments: Patient was alert and lucid through all of her interaction with me, however, in x-ray she appeared to be confused.  She asked the radiology tech where she was and why.  When I confronted the patient with her confusion later she absolutely denied that she had said these things, and became slightly irritable      UC Treatments / Results  Labs (all labs ordered are listed, but only abnormal results are displayed) Labs Reviewed  COMPLETE METABOLIC PANEL WITH GFR  CBC WITH DIFFERENTIAL/PLATELET    EKG   Radiology DG Chest 2 View  Result Date: 12/11/2020 CLINICAL DATA:  Fatigue and hypertension EXAM: CHEST - 2 VIEW COMPARISON:  Chest radiograph May 19, 2015 FINDINGS: The heart size and mediastinal contours are within normal limits. Chronic bronchitic changes with pulmonary hyperinflation. Streaky right lower lobe opacities. No pleural effusion. No pneumothorax. Facet spondylosis. IMPRESSION: 1. Streaky right lower lobe opacities may represent atelectasis or pneumonia in the appropriate clinical setting. 2. Chronic bronchitic  changes and pulmonary hyperinflation. Electronically Signed   By: Maudry Mayhew MD   On: 12/11/2020 11:02    Procedures Procedures (including critical  care time)  Medications Ordered in UC Medications - No data to display  Initial Impression / Assessment and Plan / UC Course  I have reviewed the triage vital signs and the nursing notes.  Pertinent labs & imaging results that were available during my care of the patient were reviewed by me and considered in my medical decision making (see chart for details).     Patient has believed benign hypertension at this point.  She has had untreated hypertension for at least 5 years.  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  Chest x-ray is normal.  Blood work is pending.She does agree to take a blood pressure pill once a day.  I tried to get her to go the emergency room given her spell of confusion and her vague not feeling well with her blood pressure being so high.  She stated "my blood pressure is always high at the doctor's office" Final Clinical Impressions(s) / UC Diagnoses   Final diagnoses:  Primary hypertension  Malignant hypertension     Discharge Instructions     You have high blood pressure. To prevent complications, I recommend you take medication once a day You need to see a primary care doctor on a regular basis If you feel worse instead of better, have headache, chest pain, dizziness or confusion, go straight to the emergency room    ED Prescriptions    Medication Sig Dispense Auth. Provider   lisinopril-hydrochlorothiazide (ZESTORETIC) 10-12.5 MG tablet Take 1 tablet by mouth daily. 30 tablet Eustace Moore, MD     PDMP not reviewed this encounter.   Eustace Moore, MD 12/11/20 1245

## 2020-12-11 NOTE — ED Triage Notes (Signed)
Patient states that she "didn't feel good" yesterday, no significant sx's.  Patient's husband checked her bp and it 195/116 yesterday.  Patient is not currently taking any BP meds.

## 2020-12-12 LAB — COMPLETE METABOLIC PANEL WITH GFR
AG Ratio: 1.4 (calc) (ref 1.0–2.5)
ALT: 32 U/L — ABNORMAL HIGH (ref 6–29)
AST: 34 U/L (ref 10–35)
Albumin: 4.8 g/dL (ref 3.6–5.1)
Alkaline phosphatase (APISO): 101 U/L (ref 37–153)
BUN: 14 mg/dL (ref 7–25)
CO2: 24 mmol/L (ref 20–32)
Calcium: 10 mg/dL (ref 8.6–10.4)
Chloride: 106 mmol/L (ref 98–110)
Creat: 0.81 mg/dL (ref 0.60–0.93)
GFR, Est African American: 82 mL/min/{1.73_m2} (ref >=60)
GFR, Est Non African American: 71 mL/min/{1.73_m2} (ref >=60)
Globulin: 3.4 g/dL (calc) (ref 1.9–3.7)
Glucose, Bld: 116 mg/dL — ABNORMAL HIGH (ref 65–99)
Potassium: 4 mmol/L (ref 3.5–5.3)
Sodium: 139 mmol/L (ref 135–146)
Total Bilirubin: 0.6 mg/dL (ref 0.2–1.2)
Total Protein: 8.2 g/dL — ABNORMAL HIGH (ref 6.1–8.1)

## 2020-12-12 LAB — CBC WITH DIFFERENTIAL/PLATELET
Absolute Monocytes: 468 cells/uL (ref 200–950)
Basophils Absolute: 20 cells/uL (ref 0–200)
Basophils Relative: 0.5 %
Eosinophils Absolute: 60 cells/uL (ref 15–500)
Eosinophils Relative: 1.5 %
HCT: 41.7 % (ref 35.0–45.0)
Hemoglobin: 14 g/dL (ref 11.7–15.5)
Lymphs Abs: 1024 cells/uL (ref 850–3900)
MCH: 31.3 pg (ref 27.0–33.0)
MCHC: 33.6 g/dL (ref 32.0–36.0)
MCV: 93.3 fL (ref 80.0–100.0)
MPV: 11.8 fL (ref 7.5–12.5)
Monocytes Relative: 11.7 %
Neutro Abs: 2428 cells/uL (ref 1500–7800)
Neutrophils Relative %: 60.7 %
Platelets: 100 10*3/uL — ABNORMAL LOW (ref 140–400)
RBC: 4.47 10*6/uL (ref 3.80–5.10)
RDW: 12.8 % (ref 11.0–15.0)
Total Lymphocyte: 25.6 %
WBC: 4 10*3/uL (ref 3.8–10.8)

## 2020-12-28 ENCOUNTER — Telehealth: Payer: Self-pay | Admitting: Emergency Medicine

## 2020-12-28 NOTE — Telephone Encounter (Addendum)
Pt called due to complaints of auditory hallucinations since starting BP med 2 weeks ago. Pt's BP at home was 166/79 per pt. Nina Kirby has not taken her BP med today - called to see if she should go to ED, come back to Urgent Care or wait to see Dr Ashley Royalty in 2 days (which will be patient's 1st appointment). Dr Cathren Harsh to review & RN will call pt back. Call back to patient after Dr Cathren Harsh reviewed the chart - pt to go to ED for higher level of evaluation due to co-morbidity risks. Pt verbalized an understanding.

## 2020-12-29 ENCOUNTER — Encounter: Payer: Self-pay | Admitting: *Deleted

## 2020-12-29 ENCOUNTER — Other Ambulatory Visit: Payer: Self-pay

## 2020-12-29 ENCOUNTER — Emergency Department
Admission: EM | Admit: 2020-12-29 | Discharge: 2020-12-29 | Disposition: A | Discharge status: Home / Self Care | Payer: Medicare Other | Source: Home / Self Care

## 2020-12-29 DIAGNOSIS — R059 Cough, unspecified: Secondary | ICD-10-CM

## 2020-12-29 DIAGNOSIS — R441 Visual hallucinations: Secondary | ICD-10-CM

## 2020-12-29 DIAGNOSIS — D696 Thrombocytopenia, unspecified: Secondary | ICD-10-CM | POA: Diagnosis not present

## 2020-12-29 DIAGNOSIS — T464X5A Adverse effect of angiotensin-converting-enzyme inhibitors, initial encounter: Secondary | ICD-10-CM | POA: Diagnosis not present

## 2020-12-29 MED ORDER — AMLODIPINE BESYLATE 10 MG PO TABS: 10.0000 mg | ORAL_TABLET | Freq: Every day | ORAL | 0 refills | Status: DC

## 2020-12-29 NOTE — ED Triage Notes (Signed)
Pt states that she is having hallucinations, dry cough and dental pain since starting the new BP med.

## 2020-12-29 NOTE — ED Provider Notes (Addendum)
[name]    CSN: [id number] Arrival date & time: 12/29/20  1107      History   Chief Complaint Chief Complaint  Patient presents with  . Hallucinations  . Dental Pain    HPI Nina Kirby is a 75 y.o. female.   HPI Patient was seen at the end of February for hypertension.  She came in because she was not feeling well and was noted to have a very high blood pressure of 234/118.  She is reluctant to go to the physician for any reason.  She is reluctant to take medicines for any reason.  She refused to go the emergency room at that visit.  Lab work was done, she was started on lisinopril 10 hydrochlorothiazide 25, she was advised to see a family medicine doctor ASAP in follow-up. Patient called her yesterday to report she felt like she was having side effects on the lisinopril medicine.  She reports that she started having hallucinations within a week of starting the medications.  They are visual hallucinations.  They are only in the evening when she sits down in her chair with her husband, she sees both people and objects.  At one point thought there was a pet on her lap and when she tried to pet it, there was nothing there.  No auditory hallucinations.  No other change in behavior.  She is eating well.  She is sleeping well.  No difficulty with memory or cognition.  No tearfulness or feeling like she has lost control She also states that her teeth hurt more since she started lisinopril, all of her teeth.  Thinks this might also be a reaction. She also has developed a dry cough.  This is the only one of the 3 symptoms that I know is likely a side effect from the lisinopril. When she called yesterday she was told to go to the emergency room for higher level of care.  She chose not to do this, and waited to come in here today.  I explained to her the differences in the acuity of care and diagnoses that are managed in the ER versus an urgent care center She denies any chest  pain, shortness of breath, pedal edema .  Her blood work.  It is largely normal with the exception of thrombocytopenia.  I told her that her blood work will need to be repeated Past Medical History:  Diagnosis Date  . H/O renal calculi    right  . Renal calculi    3 days ago    Patient Active Problem List   Diagnosis Date Noted  . Hyperlipemia 01/06/2016  . Prediabetes 01/06/2016  . Elevated TSH 01/06/2016  . HTN (hypertension) 01/03/2016  . Right knee pain 01/03/2016  . Plantar fasciitis of right foot 01/03/2016  . Vitamin D deficiency 01/03/2016  . Hashimoto's thyroiditis 01/03/2016    Past Surgical History:  Procedure Laterality Date  . CHOLECYSTECTOMY    . Normal Stess test  09/05/12   Normal Nuclear Stress test. See scanned document  . TONSILLECTOMY    . TUBAL LIGATION      OB History   No obstetric history on file.      Home Medications    Prior to Admission medications   Medication Sig Start Date End Date Taking? Authorizing Provider  amLODipine (NORVASC) 10 MG tablet Take 1 tablet (10 mg total) by mouth daily. 12/29/20  Yes Eustace Moore, MD  Acetaminophen (TYLENOL 8 HOUR PO) Take by  mouth.    [provider]  IBUPROFEN PO Take by mouth.    [provider]  lisinopril-hydrochlorothiazide (ZESTORETIC) 10-12.5 MG tablet Take 1 tablet by mouth daily. 12/11/20 12/29/20  Eustace Moore, MD    Family History History reviewed. No pertinent family history.  Social History Social History   Tobacco Use  . Smoking status: Never Smoker  . Smokeless tobacco: Never Used  Vaping Use  . Vaping Use: Never used  Substance Use Topics  . Alcohol use: Yes  . Drug use: No     Allergies   Zithromax [azithromycin] and Codeine   Review of Systems Review of Systems See HPI  Physical Exam Triage Vital Signs ED Triage Vitals  Enc Vitals Group     BP 12/29/20 1120 (!) 176/95     Pulse Rate 12/29/20 1120 95     Resp 12/29/20 1120 18      Temp 12/29/20 1120 (!) 97.5 F (36.4 C)     Temp Source 12/29/20 1120 Oral     SpO2 12/29/20 1120 98 %     Weight 12/29/20 1119 192 lb (87.1 kg)     Height 12/29/20 1119 5\' 7"  (1.702 m)     Head Circumference --      Peak Flow --      Pain Score 12/29/20 1119 0     Pain Loc --      Pain Edu? --      Excl. in GC? --    No data found.  Updated Vital Signs BP (!) 176/95 (BP Location: Left Arm)   Pulse 95   Temp (!) 97.5 F (36.4 C) (Oral)   Resp 18   Ht 5\' 7"  (1.702 m)   Wt 87.1 kg   SpO2 98%   BMI 30.07 kg/m      Physical Exam Constitutional:      General: She is not in acute distress.    Appearance: She is well-developed.     Comments: Overweight.  Clear mentation.  Tremulous  HENT:     Head: Normocephalic and atraumatic.     Mouth/Throat:     Comments: Mask is in place Eyes:     Conjunctiva/sclera: Conjunctivae normal.     Pupils: Pupils are equal, round, and reactive to light.  Cardiovascular:     Rate and Rhythm: Normal rate.     Heart sounds: Normal heart sounds.  Pulmonary:     Effort: Pulmonary effort is normal. No respiratory distress.     Breath sounds: Normal breath sounds.     Comments: Heart and lung exam is normal Abdominal:     Palpations: Abdomen is soft.  Musculoskeletal:        General: Normal range of motion.     Cervical back: Normal range of motion.     Right lower leg: No edema.     Left lower leg: No edema.  Skin:    General: Skin is warm and dry.  Neurological:     Mental Status: She is alert.  Psychiatric:        Attention and Perception: Attention normal.        Mood and Affect: Mood is anxious.        Speech: Speech normal.        Behavior: Behavior normal.        Thought Content: Thought content normal.        Cognition and Memory: Cognition normal.        Judgment: Judgment  normal.     Comments: Appears anxious, tremulous      UC Treatments / Results  Labs (all labs ordered are listed, but only abnormal results are  displayed) Labs Reviewed - No data to display  EKG   Radiology No results found.  Procedures Procedures (including critical care time)  Medications Ordered in UC Medications - No data to display  Initial Impression / Assessment and Plan / UC Course  I have reviewed the triage vital signs and the nursing notes.  Pertinent labs & imaging results that were available during my care of the patient were reviewed by me and considered in my medical decision making (see chart for details).     I explained to the patient that her auditory hallucinations at bedtime (sundowning) are not usually a medicine side effect.  In any event since she is clear right now, and has a husband at home to watch her, I feel it safe to send her home with a change in medication and close observation.  She does have an appointment with her primary care doctor for Monday the 21st, 5 days from now. I emphasized to her the need to go to the emergency room if she feels worse instead of better at any time Her hypertension is improved on medication.  She is still elevated but not in a dangerous range at this time. Final Clinical Impressions(s) / UC Diagnoses   Final diagnoses:  Adverse effect of lisinopril, initial encounter  Cough  Visual hallucination  Thrombocytopenia (HCC)     Discharge Instructions     Stop lisinopril hydrochlorothiazide (Zestoretic) Take amlodipine 1 pill a day See Dr. Ashley Royalty on Monday Drink plenty of water If you feel worse instead of better at any time, go to the emergency room   ED Prescriptions    Medication Sig Dispense Auth. Provider   amLODipine (NORVASC) 10 MG tablet Take 1 tablet (10 mg total) by mouth daily. 15 tablet Eustace Moore, MD     PDMP not reviewed this encounter.   Eustace Moore, MD 12/29/20 1230    Eustace Moore, MD 12/29/20 [id number]

## 2020-12-29 NOTE — Discharge Instructions (Signed)
Stop lisinopril hydrochlorothiazide (Zestoretic) Take amlodipine 1 pill a day See Dr. Ashley Royalty on Monday Drink plenty of water If you feel worse instead of better at any time, go to the emergency room

## 2021-01-03 ENCOUNTER — Ambulatory Visit: Payer: Medicare Other | Admitting: Family Medicine

## 2021-01-11 ENCOUNTER — Ambulatory Visit (INDEPENDENT_AMBULATORY_CARE_PROVIDER_SITE_OTHER): Payer: Medicare Other | Admitting: Medical-Surgical

## 2021-01-11 ENCOUNTER — Encounter: Payer: Self-pay | Admitting: Medical-Surgical

## 2021-01-11 ENCOUNTER — Other Ambulatory Visit: Payer: Self-pay

## 2021-01-11 VITALS — BP 169/79 | HR 98 | Temp 98.2°F | Ht 64.5 in | Wt 198.1 lb

## 2021-01-11 DIAGNOSIS — E063 Autoimmune thyroiditis: Secondary | ICD-10-CM

## 2021-01-11 DIAGNOSIS — R7303 Prediabetes: Secondary | ICD-10-CM | POA: Diagnosis not present

## 2021-01-11 DIAGNOSIS — I10 Essential (primary) hypertension: Secondary | ICD-10-CM

## 2021-01-11 DIAGNOSIS — Z114 Encounter for screening for human immunodeficiency virus [HIV]: Secondary | ICD-10-CM

## 2021-01-11 DIAGNOSIS — E782 Mixed hyperlipidemia: Secondary | ICD-10-CM

## 2021-01-11 DIAGNOSIS — Z1159 Encounter for screening for other viral diseases: Secondary | ICD-10-CM

## 2021-01-11 DIAGNOSIS — Z7689 Persons encountering health services in other specified circumstances: Secondary | ICD-10-CM | POA: Diagnosis not present

## 2021-01-11 MED ORDER — AMLODIPINE BESYLATE 10 MG PO TABS: 10.0000 mg | ORAL_TABLET | Freq: Every day | ORAL | 3 refills | Status: DC

## 2021-01-11 NOTE — Progress Notes (Signed)
New Patient Office Visit  Subjective:  Patient ID: Nina Kirby, female    DOB: 1946-03-28  Age: 75 y.o. MRN: [id number]  CC:  Chief Complaint  Patient presents with  . Establish Care    HPI [name] presents to establish care.   She is a pleasant 74 year old female who was recently seen in urgent care for elevated blood pressure with readings of 195/116.  At that time, they started her on lisinopril-HCTZ which she took as prescribed.  Unfortunately, she did not tolerate the medication due to side effects of hallucinations and dental pain.  When she returned to the urgent care 3 weeks after starting the medication, her blood pressure was 234/118 and she refused to go to the emergency room for further evaluation.  She was taken off lisinopril-HCTZ and this was added to her allergy list.  She was started on amlodipine 10 mg daily and is currently taking that medication, tolerating it well without side effects.  Has been monitoring her blood pressure at home with readings that are outside of goal but much improved from her urgent care readings.  Notes that her bottom number stays in the 70s and low 80s but her top number is routinely greater than 140.  She does have a history of medical noncompliance and poor follow-up.  Admits that she does not like doctors and she stopped coming to this practice because she did not like Dr. Denyse Amass.  She does have a history of hyperlipidemia but has no interest in starting medications to help manage this.  She is also not happy about the idea that high blood pressure such as hers will require lifelong treatment.  Past Medical History:  Diagnosis Date  . H/O renal calculi    right  . Hypertension   . Renal calculi    3 days ago    Past Surgical History:  Procedure Laterality Date  . CHOLECYSTECTOMY    . Normal Stess test  09/05/12   Normal Nuclear Stress test. See scanned document  . TONSILLECTOMY    . TUBAL LIGATION      History reviewed. No  pertinent family history.  Social History   Socioeconomic History  . Marital status: Married    Spouse name: Not on file  . Number of children: Not on file  . Years of education: Not on file  . Highest education level: Not on file  Occupational History  . Not on file  Tobacco Use  . Smoking status: Never Smoker  . Smokeless tobacco: Never Used  Vaping Use  . Vaping Use: Never used  Substance and Sexual Activity  . Alcohol use: Yes    Comment: rarely  . Drug use: Never  . Sexual activity: Not on file  Other Topics Concern  . Not on file  Social History Narrative  . Not on file   Social Determinants of Health   Financial Resource Strain: Not on file  Food Insecurity: Not on file  Transportation Needs: Not on file  Physical Activity: Not on file  Stress: Not on file  Social Connections: Not on file  Intimate Partner Violence: Not on file    ROS Review of Systems  Constitutional: Negative for chills, fatigue, fever and unexpected weight change.  Respiratory: Negative for cough, chest tightness, shortness of breath and wheezing.   Cardiovascular: Negative for chest pain, palpitations and leg swelling.  Genitourinary: Negative for dysuria, frequency and urgency.  Neurological: Negative for dizziness, light-headedness and headaches.  Psychiatric/Behavioral: Negative for dysphoric  mood, self-injury, sleep disturbance and suicidal ideas. The patient is not nervous/anxious.     Objective:   Today's Vitals: BP (!) 169/79   Pulse 98   Temp 98.2 F (36.8 C)   Ht 5' 4.5" (1.638 m)   Wt 198 lb 1.6 oz (89.9 kg)   SpO2 97%   BMI 33.48 kg/m   Physical Exam Vitals reviewed.  Constitutional:      General: She is not in acute distress.    Appearance: Normal appearance.  HENT:     Head: Normocephalic and atraumatic.  Cardiovascular:     Rate and Rhythm: Normal rate and regular rhythm.     Pulses: Normal pulses.     Heart sounds: S2 normal. Murmur heard.   Systolic  murmur is present with a grade of 2/6. No friction rub. No gallop.   Pulmonary:     Effort: Pulmonary effort is normal. No respiratory distress.     Breath sounds: Normal breath sounds. No wheezing.  Musculoskeletal:     Right lower leg: No edema.     Left lower leg: No edema.  Skin:    General: Skin is warm and dry.  Neurological:     Mental Status: She is alert and oriented to person, place, and time.  Psychiatric:        Mood and Affect: Mood normal.        Behavior: Behavior normal.        Thought Content: Thought content normal.        Judgment: Judgment normal.     Assessment & Plan:   1. Encounter to establish care Reviewed available information and discussed health care concerns with patient.  She is resistant to taking medications and has no interest in preventative care at this point.  2. Primary hypertension Continue amlodipine 10 mg daily.  Discussed the goal of 130/80 or less.  Unlikely that she will get to goal with just the amlodipine.  Discussed adding a second agent but she feels like she has only been on amlodipine for a short period of time and would like to avoid adding a second medication in today.  She will monitor her blood pressure at home using their blood pressure cuff.  3. Prediabetes Mild elevation in glucose (116) from lab draw at urgent care 1 month ago.  We will hold off on further lab work today.  4. Mixed hyperlipidemia History of hyperlipidemia but she is strongly averse to statin medications.  Suggest low-fat heart healthy diet, weight loss, and regular exercise.  5. Hashimoto's thyroiditis Last TSH checked in 2017 was mildly elevated.  As she is hesitant for further testing at this time, we will hold off on TSH for now.  We will plan to recheck in the near future once we get her blood pressures back under control.  Outpatient Encounter Medications as of 01/11/2021  Medication Sig  . Acetaminophen (TYLENOL 8 HOUR PO) Take by mouth.  Marland Kitchen  ibuprofen (ADVIL) 200 MG tablet Take 200 mg by mouth every 6 (six) hours as needed.  . [DISCONTINUED] amLODipine (NORVASC) 10 MG tablet Take 1 tablet (10 mg total) by mouth daily.  Marland Kitchen amLODipine (NORVASC) 10 MG tablet Take 1 tablet (10 mg total) by mouth daily.  . [DISCONTINUED] lisinopril-hydrochlorothiazide (ZESTORETIC) 10-12.5 MG tablet Take 1 tablet by mouth daily.   No facility-administered encounter medications on file as of 01/11/2021.    Follow-up: Return in about 2 weeks (around 01/25/2021) for nurse visit for BP check.  Clearnce Sorrel, DNP, APRN, FNP-BC [facility]

## 2021-05-16 ENCOUNTER — Encounter: Payer: Self-pay | Admitting: Emergency Medicine

## 2021-05-16 ENCOUNTER — Emergency Department
Admission: EM | Admit: 2021-05-16 | Discharge: 2021-05-16 | Disposition: A | Discharge status: Home / Self Care | Payer: Medicare Other | Source: Home / Self Care | Attending: Family Medicine | Admitting: Family Medicine

## 2021-05-16 ENCOUNTER — Other Ambulatory Visit: Payer: Self-pay

## 2021-05-16 DIAGNOSIS — J069 Acute upper respiratory infection, unspecified: Secondary | ICD-10-CM

## 2021-05-16 DIAGNOSIS — Z20822 Contact with and (suspected) exposure to covid-19: Secondary | ICD-10-CM | POA: Diagnosis not present

## 2021-05-16 NOTE — ED Triage Notes (Signed)
Patient is requesting a COVID test.  Her husband has COVID.  Patient is having congestion, cough, fatigued.  Patient is vaccinated for COVID

## 2021-05-16 NOTE — ED Provider Notes (Signed)
Have someone [name]    CSN: [id number] Arrival date & time: 05/16/21  3419      History   Chief Complaint Chief Complaint  Patient presents with   URI    HPI Nina Kirby is a 75 y.o. female.   HPI  Nina Kirby's husband has COVID.  She has developed respiratory symptoms.  She is basically here for COVID test.  She has some aches.  No fever or chills.  No recent sore throat and cough.  Symptoms are minor.  She is COVID vaccinated. She is taking her blood pressure medication.  She feels like she is tolerating it well.  She states her blood pressure is lower at home than we measured today.  She is following up with primary care  Past Medical History:  Diagnosis Date   H/O renal calculi    right   Hypertension    Renal calculi    3 days ago    Patient Active Problem List   Diagnosis Date Noted   Hyperlipemia 01/06/2016   Prediabetes 01/06/2016   Elevated TSH 01/06/2016   HTN (hypertension) 01/03/2016   Right knee pain 01/03/2016   Plantar fasciitis of right foot 01/03/2016   Vitamin D deficiency 01/03/2016   Hashimoto's thyroiditis 01/03/2016    Past Surgical History:  Procedure Laterality Date   CHOLECYSTECTOMY     Normal Stess test  09/05/12   Normal Nuclear Stress test. See scanned document   TONSILLECTOMY     TUBAL LIGATION      OB History   No obstetric history on file.      Home Medications    Prior to Admission medications   Medication Sig Start Date End Date Taking? Authorizing Provider  Acetaminophen (TYLENOL 8 HOUR PO) Take by mouth.   Yes [provider]  amLODipine (NORVASC) 10 MG tablet Take 1 tablet (10 mg total) by mouth daily. 01/11/21  Yes Christen Butter, NP  ibuprofen (ADVIL) 200 MG tablet Take 200 mg by mouth every 6 (six) hours as needed.   Yes [provider]  lisinopril-hydrochlorothiazide (ZESTORETIC) 10-12.5 MG tablet Take 1 tablet by mouth daily. 12/11/20 12/29/20  Nina Moore, MD    Family  History No family history on file.  Social History Social History   Tobacco Use   Smoking status: Never   Smokeless tobacco: Never  Vaping Use   Vaping Use: Never used  Substance Use Topics   Alcohol use: Yes    Comment: rarely   Drug use: Never     Allergies   Azithromycin, Codeine, and Lisinopril-hydrochlorothiazide   Review of Systems Review of Systems See HPI  Physical Exam Triage Vital Signs ED Triage Vitals  Enc Vitals Group     BP 05/16/21 1003 (!) 163/83     Pulse Rate 05/16/21 1003 96     Resp -- 18     Temp 05/16/21 1003 99 F (37.2 C)     Temp Source 05/16/21 1003 Oral     SpO2 05/16/21 1003 96 %     Weight 05/16/21 1006 189 lb (85.7 kg)     Height 05/16/21 1006 5\' 6"  (1.676 m)     Head Circumference --      Peak Flow --      Pain Score 05/16/21 1006 0     Pain Loc --      Pain Edu? --      Excl. in GC? --    No data found.  Updated  Vital Signs BP (!) 163/83 (BP Location: Left Arm)   Pulse 96   Temp 99 F (37.2 C) (Oral)   Ht 5\' 6"  (1.676 m)   Wt 85.7 kg   SpO2 96%   BMI 30.51 kg/m       Physical Exam Constitutional:      General: She is not in acute distress.    Appearance: She is well-developed.     Comments: Smiling.  No distress.  Mildly overweight  HENT:     Head: Normocephalic and atraumatic.     Mouth/Throat:     Comments: Mask is in place Eyes:     Conjunctiva/sclera: Conjunctivae normal.     Pupils: Pupils are equal, round, and reactive to light.  Cardiovascular:     Rate and Rhythm: Normal rate and regular rhythm.     Heart sounds: Normal heart sounds.  Pulmonary:     Effort: Pulmonary effort is normal. No respiratory distress.     Breath sounds: Normal breath sounds. No wheezing or rales.  Abdominal:     General: There is no distension.     Palpations: Abdomen is soft.  Musculoskeletal:        General: Normal range of motion.     Cervical back: Normal range of motion.  Skin:    General: Skin is warm and dry.   Neurological:     General: No focal deficit present.     Mental Status: She is alert.  Psychiatric:        Mood and Affect: Mood normal.        Behavior: Behavior normal.     UC Treatments / Results  Labs (all labs ordered are listed, but only abnormal results are displayed) Labs Reviewed  NOVEL CORONAVIRUS, NAA    EKG   Radiology No results found.  Procedures Procedures (including critical care time)  Medications Ordered in UC Medications - No data to display  Initial Impression / Assessment and Plan / UC Course  I have reviewed the triage vital signs and the nursing notes.  Pertinent labs & imaging results that were available during my care of the patient were reviewed by me and considered in my medical decision making (see chart for details).     Reviewed COVID.  Quarantine.  She does not have indications for PACs lipid except for her age of 69.  She chooses not to take this unless her COVID test is positive.  We will call her with results Final Clinical Impressions(s) / UC Diagnoses   Final diagnoses:  Close exposure to COVID-19 virus  Viral URI with cough     Discharge Instructions      Check MyChart for your test result You will be called by a nurse if any tests are positive You need to stay home and isolate, treat symptoms with over-the-counter medications Take Tylenol or ibuprofen or Aleve for your pain, or fever   ED Prescriptions   None    PDMP not reviewed this encounter.   60, MD 05/16/21 [phone]

## 2021-05-16 NOTE — Discharge Instructions (Addendum)
Check MyChart for your test result You will be called by a nurse if any tests are positive You need to stay home and isolate, treat symptoms with over-the-counter medications Take Tylenol or ibuprofen or Aleve for your pain, or fever

## 2021-05-17 LAB — NOVEL CORONAVIRUS, NAA: SARS-CoV-2, NAA: DETECTED — AB

## 2021-05-17 LAB — SARS-COV-2, NAA 2 DAY TAT

## 2021-10-07 ENCOUNTER — Telehealth: Payer: Self-pay | Admitting: General Practice

## 2021-10-07 NOTE — Telephone Encounter (Signed)
Transition Care Management Follow-up Telephone Call Date of discharge and from where: 10/06/21 from [facility] How have you been since you were released from the hospital? She has a different PCP at a different practice. Any questions or concerns? No

## 2021-11-10 DIAGNOSIS — E038 Other specified hypothyroidism: Secondary | ICD-10-CM | POA: Insufficient documentation

## 2021-11-11 DIAGNOSIS — E854 Organ-limited amyloidosis: Secondary | ICD-10-CM | POA: Insufficient documentation

## 2021-11-11 DIAGNOSIS — G5603 Carpal tunnel syndrome, bilateral upper limbs: Secondary | ICD-10-CM | POA: Insufficient documentation

## 2021-11-11 DIAGNOSIS — M79641 Pain in right hand: Secondary | ICD-10-CM | POA: Insufficient documentation

## 2022-01-10 ENCOUNTER — Other Ambulatory Visit: Payer: Self-pay

## 2022-01-10 ENCOUNTER — Encounter: Payer: Self-pay | Admitting: Medical-Surgical

## 2022-01-10 ENCOUNTER — Ambulatory Visit (INDEPENDENT_AMBULATORY_CARE_PROVIDER_SITE_OTHER): Payer: Medicare Other | Admitting: Medical-Surgical

## 2022-01-10 VITALS — BP 214/106 | HR 72 | Resp 20 | Ht 66.0 in | Wt 189.2 lb

## 2022-01-10 DIAGNOSIS — M7989 Other specified soft tissue disorders: Secondary | ICD-10-CM

## 2022-01-10 DIAGNOSIS — R296 Repeated falls: Secondary | ICD-10-CM

## 2022-01-10 DIAGNOSIS — E038 Other specified hypothyroidism: Secondary | ICD-10-CM | POA: Diagnosis not present

## 2022-01-10 DIAGNOSIS — R457 State of emotional shock and stress, unspecified: Secondary | ICD-10-CM

## 2022-01-10 DIAGNOSIS — Z09 Encounter for follow-up examination after completed treatment for conditions other than malignant neoplasm: Secondary | ICD-10-CM

## 2022-01-10 DIAGNOSIS — I10 Essential (primary) hypertension: Secondary | ICD-10-CM

## 2022-01-10 DIAGNOSIS — E063 Autoimmune thyroiditis: Secondary | ICD-10-CM

## 2022-01-10 DIAGNOSIS — M79642 Pain in left hand: Secondary | ICD-10-CM

## 2022-01-10 DIAGNOSIS — M79641 Pain in right hand: Secondary | ICD-10-CM

## 2022-01-10 NOTE — Progress Notes (Signed)
?HPI with pertinent ROS:  ? ?CC: Hospital follow-up ? ?HPI: ?Pleasant 76 year old female accompanied by her daughter and husband presenting today for hospital discharge follow up. She was last seen in our office approximately 1 year ago but subsequently switched to a different PCP in the [facility] system. Today, she comes back to re-establish care at our practice and to follow up on her most recent hospitalization. She notes that her most recent troubles started in December after she experienced a fall (12/10) with loss of consciousness accompanied by fecal and urinary incontinence. She was taken to the hospital and admitted for observation. During that stay, she was found to have right temporal delta slowing on EEG but this was considered a nonspecific finding and she was discharged with instructions to follow up with neurology. Approximately 8 days later, she developed bilateral hand pain with mild swelling that worsened until she had very little use of her hands and was unable to perform even simple tasks. Symptoms worsened until she had to have someone feed her because she couldn't grasp the cutlery. She has been to her previous PCP and neurology since then but no explanation has been identified for her hand pain/swelling. Neurology did a nerve conduction study which found bilateral carpal tunnel with median nerve damage. They sent her to discuss options and surgery was recommended. She is not interested in surgery. Reports that over the last 4 months, her swelling and sensation has improved slightly but is still excruciating unless she has both Tylenol (1082m) and Ibuprofen (4040m on board every 4 hours.  Has tried multiple medications including nortriptyline, gabapentin, pregabalin, cymbalta, hydrocodone, prednisone taper, and topical Voltaren without relief of symptoms. Has been unable to tolerate most of the medications for more than a few doses due to side effects such as mental status changes and  worsened swelling of the hands.  ? ?History of Hashimoto's thyroiditis that has reportedly been in remission for the last 40 years. On her admission on 09/24/21, she was found to have a TSH of 11 and subsequently started on levothyroxine. She is currently taking 2567mdaily, tolerating well without side effects.  ? ?Long history of uncontrolled hypertension compounded by anxiety surround medical appointments. Has been treated with Amlodipine but this was stopped due to hand swelling. Tried taking Lisinopril-HCTZ in the past but had mental status changes along with jaw pain. Now taking Losartan 87m13mily and reporting it makes her feel jittery and just bad overall. Was instructed recently to increase Losartan to 100mg44mly but her husband reports he was not doing that since the 87mg 67m was already causing such problems. Wants to stop the Losartan and switch to something else. Was also started on metoprolol 87mg t12m daily during her most recent hospital evaluation. Doing well on metoprolol with no identifiable side effects.  ? ?I reviewed the past medical history, family history, social history, surgical history, and allergies today and no changes were needed.  Please see the problem list section below in epic for further details. ? ? ?Physical exam:  ? ?General: Well Developed, well nourished, and in no acute distress.  ?Neuro: Alert and oriented x3.  ?HEENT: Normocephalic, atraumatic.  ?Skin: Warm and dry. ?Cardiac: Regular rate and rhythm, no murmurs rubs or gallops, no lower extremity edema. Bilateral radial pulses 2+.  ?Respiratory: Clear to auscultation bilaterally. Not using accessory muscles, speaking in full sentences. ?Hands: bilateral nonpitting edema to dorsal and ventral hands involving all fingers. Brisk capillary refill. Skin warm to touch  without significant erythema. Limited ROM to flexion and hyperextension of all fingers. Limited sensation to digits 1-3 bilaterally with medial aspect of 4th  digit also affected. Sensation in the 5th digits normal.  ? ?Impression and Recommendations:   ? ?1. Hospital discharge follow-up ?Reviewed available records and discharge summary. ? 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 ? ?3. Uncontrolled hypertension ?BP extremely uncontrolled in office today but she did bring a log of home readings between 129-175/65-92 with most readings being in the 150s/70s. She wants to stop Losartan due to intolerance of side effects but has a long history of medication intolerances. Recommend referring to advanced HTN clinic for their input but she would like to think on this. Continue Metoprolol 511mBID. Reviewing records for previously prescribed medications and potential options for replacing Losartan. Would like better BP control with home readings. Noting white coat HTN as a diagnosis.  ? ?4. Frequent falls ?Unclear etiology. Suspect related to several factors including poor BP control, pain, neurological changes related to seizure, and limited physical activity.  ? ?5. Bilateral hand swelling ?6. Bilateral hand pain ?Unclear etiology despite workup by hospital, prior PCP, and neurology. Patient has had an elevated CRP and ESR as well as a positive ANA in the recent past and would like to see Rheumatology. Referral placed.  ?- Ambulatory referral to Rheumatology ? ?7. Emotional stress ?Understandably struggling with limitations in physical functioning. Briefly discussed management of mood and the stress associated with her health issues. Not currently interested in medication due to sensitivity to most medications leading to intolerance.  ? ?Return if symptoms worsen or fail to improve. Further follow up pending medication history evaluation and changes.  ? ?50 minutes was spent in this encounter reviewing extensive records, including external results and documentation, in face-to-face discussion with the patient/family, and in review  of the case with my supervising provider.  ? ?___________________________________________ ?JoClearnce SorrelDNP, APRN, FNP-BC ?Primary Care and Sports Medicine ?[place]

## 2022-01-13 ENCOUNTER — Encounter: Payer: Self-pay | Admitting: Medical-Surgical

## 2022-01-13 MED ORDER — VALSARTAN 40 MG PO TABS
40.0000 mg | ORAL_TABLET | Freq: Every day | ORAL | 3 refills | Status: DC
Start: 2022-01-13 — End: 2022-02-10

## 2022-01-13 NOTE — Telephone Encounter (Signed)
Called and spoke with patient, she is okay trying Valsartan, please send to the pharmacy on file.  ?

## 2022-01-13 NOTE — Telephone Encounter (Signed)
Tried to call patient, husband not home (has cell with him) tried alternative number he gave me and no answer.  ?

## 2022-01-16 NOTE — Telephone Encounter (Signed)
I sent response to daughter, if I missed anything let me know. Thanks! ?

## 2022-01-25 ENCOUNTER — Ambulatory Visit: Payer: Self-pay

## 2022-01-25 ENCOUNTER — Ambulatory Visit (INDEPENDENT_AMBULATORY_CARE_PROVIDER_SITE_OTHER): Payer: Medicare Other

## 2022-01-25 ENCOUNTER — Ambulatory Visit (INDEPENDENT_AMBULATORY_CARE_PROVIDER_SITE_OTHER): Payer: Medicare Other | Admitting: Sports Medicine

## 2022-01-25 DIAGNOSIS — M79641 Pain in right hand: Secondary | ICD-10-CM | POA: Diagnosis not present

## 2022-01-25 DIAGNOSIS — M79642 Pain in left hand: Secondary | ICD-10-CM | POA: Diagnosis not present

## 2022-01-25 DIAGNOSIS — E063 Autoimmune thyroiditis: Secondary | ICD-10-CM

## 2022-01-25 DIAGNOSIS — M542 Cervicalgia: Secondary | ICD-10-CM

## 2022-01-25 DIAGNOSIS — G5603 Carpal tunnel syndrome, bilateral upper limbs: Secondary | ICD-10-CM | POA: Diagnosis not present

## 2022-01-25 DIAGNOSIS — G8929 Other chronic pain: Secondary | ICD-10-CM

## 2022-01-25 MED ORDER — LEVOTHYROXINE SODIUM 50 MCG PO TABS: 50.0000 ug | ORAL_TABLET | Freq: Every day | ORAL | 3 refills | Status: DC

## 2022-01-25 NOTE — Assessment & Plan Note (Signed)
TSH is still high on 25 mcg of levothyroxine, increasing to 50 mcg with a 6-week recheck.   ?Goal TSH less than 2.5. ?

## 2022-01-25 NOTE — Assessment & Plan Note (Signed)
Nerve conduction and EMG did not show any evidence of cervical radiculopathy however she does have axial neck pain with radiation to the neck and periscapular regions, upper shoulders. ?She still likely has cervical spondylosis, adding x-rays, declines physical therapy as they see if she will tolerate it. ?She has been on gabapentin and Lyrica in the past, was not able to tolerate. ?She is tearful in the exam room, she also has hyperalgesia and allodynia, I do suspect there is an element of fibromyalgia as well as cervical spondylosis. ?Adding cervical spine x-rays, home conditioning. ?I really do think we should use Cymbalta, but I do not want to hit them with too much at once. ?

## 2022-01-25 NOTE — Progress Notes (Signed)
? ? ?Procedures performed today:   ? ?Procedure: Real-time Ultrasound Guided hydrodissection of the left median nerve at the carpal tunnel ?Device: Samsung HS60 ?Verbal informed consent obtained.  ?Time-out conducted.  ?Noted no overlying erythema, induration, or other signs of local infection.  ?Skin prepped in a sterile fashion.  ?Local anesthesia: Topical Ethyl chloride.  ?With sterile technique and under real time ultrasound guidance: Noted enlarged nerve, using a 25-gauge needle advanced into the carpal tunnel, taking care to avoid intraneural injection I injected medication both superficial to and deep to the median nerve freeing it from surrounding structures, I then redirected the needle deep and injected further medication around the flexor tendons deep within the carpal tunnel for a total of 1 cc kenalog 40, 5 cc 1% lidocaine without epinephrine. ?Completed without difficulty  ?Advised to call if fevers/chills, erythema, induration, drainage, or persistent bleeding.  ?Images permanently stored and available for review in PACS.  ?Impression: Technically successful ultrasound guided median nerve hydrodissection. ? ?Procedure: Real-time Ultrasound Guided hydrodissection of the right median nerve at the carpal tunnel ?Device: Samsung HS60 ?Verbal informed consent obtained.  ?Time-out conducted.  ?Noted no overlying erythema, induration, or other signs of local infection.  ?Skin prepped in a sterile fashion.  ?Local anesthesia: Topical Ethyl chloride.  ?With sterile technique and under real time ultrasound guidance: Noted enlarged nerve, using a 25-gauge needle advanced into the carpal tunnel, taking care to avoid intraneural injection I injected medication both superficial to and deep to the median nerve freeing it from surrounding structures, I then redirected the needle deep and injected further medication around the flexor tendons deep within the carpal tunnel for a total of 1 cc kenalog 40, 5 cc 1%  lidocaine without epinephrine. ?Completed without difficulty  ?Advised to call if fevers/chills, erythema, induration, drainage, or persistent bleeding.  ?Images permanently stored and available for review in PACS.  ?Impression: Technically successful ultrasound guided median nerve hydrodissection. ? ?Independent interpretation of notes and tests performed by another provider:  ? ?None. ? ?Brief History, Exam, Impression, and Recommendations:   ? ?Carpal tunnel syndrome, bilateral ?76 year old female, multiple medical problems, dominant symptom today is bilateral hand pain, numbness, tingling. ?She does have severe hypothyroidism. ?She did have a nerve conduction study and EMG not too long ago that showed severe bilateral carpal tunnel syndrome without evidence of cervical radiculopathy, widespread peripheral neuropathy or myopathy. ?She tried some nighttime splinting but not for very long, due to severity of symptoms we did bilateral median nerve hydrodissections today, return to see me in 6 weeks to reevaluate. ? ? ?Hashimoto's thyroiditis ?TSH is still high on 25 mcg of levothyroxine, increasing to 50 mcg with a 6-week recheck.   ?Goal TSH less than 2.5. ? ?Chronic neck pain ?Nerve conduction and EMG did not show any evidence of cervical radiculopathy however she does have axial neck pain with radiation to the neck and periscapular regions, upper shoulders. ?She still likely has cervical spondylosis, adding x-rays, declines physical therapy as they see if she will tolerate it. ?She has been on gabapentin and Lyrica in the past, was not able to tolerate. ?She is tearful in the exam room, she also has hyperalgesia and allodynia, I do suspect there is an element of fibromyalgia as well as cervical spondylosis. ?Adding cervical spine x-rays, home conditioning. ?I really do think we should use Cymbalta, but I do not want to hit them with too much at once. ? ? ? ?___________________________________________ ?Nina Kirby.  Nina Kirby, M.D., ABFM., CAQSM. ?  Primary Care and Sports Medicine ?[place] ? ?Adjunct Instructor of Family Medicine  ?[employer] ?

## 2022-01-25 NOTE — Assessment & Plan Note (Signed)
77 year old female, multiple medical problems, dominant symptom today is bilateral hand pain, numbness, tingling. ?She does have severe hypothyroidism. ?She did have a nerve conduction study and EMG not too long ago that showed severe bilateral carpal tunnel syndrome without evidence of cervical radiculopathy, widespread peripheral neuropathy or myopathy. ?She tried some nighttime splinting but not for very long, due to severity of symptoms we did bilateral median nerve hydrodissections today, return to see me in 6 weeks to reevaluate. ? ?

## 2022-01-26 ENCOUNTER — Encounter: Payer: Self-pay | Admitting: Sports Medicine

## 2022-02-10 ENCOUNTER — Encounter: Payer: Self-pay | Admitting: Medical-Surgical

## 2022-02-10 ENCOUNTER — Ambulatory Visit (INDEPENDENT_AMBULATORY_CARE_PROVIDER_SITE_OTHER): Payer: Medicare Other | Admitting: Medical-Surgical

## 2022-02-10 VITALS — BP 177/101 | HR 80 | Resp 20 | Ht 66.0 in | Wt 184.1 lb

## 2022-02-10 DIAGNOSIS — I10 Essential (primary) hypertension: Secondary | ICD-10-CM | POA: Diagnosis not present

## 2022-02-10 DIAGNOSIS — E063 Autoimmune thyroiditis: Secondary | ICD-10-CM | POA: Diagnosis not present

## 2022-02-10 DIAGNOSIS — F418 Other specified anxiety disorders: Secondary | ICD-10-CM | POA: Diagnosis not present

## 2022-02-10 MED ORDER — METOPROLOL SUCCINATE ER 50 MG PO TB24: 50.0000 mg | ORAL_TABLET | Freq: Every day | ORAL | 0 refills | Status: DC

## 2022-02-10 MED ORDER — LISINOPRIL 5 MG PO TABS: 5.0000 mg | ORAL_TABLET | Freq: Every day | ORAL | 3 refills | Status: DC

## 2022-02-10 NOTE — Patient Instructions (Signed)
Increase levothyroxine to 1.5 tablets (25mg  each to equal 37.5mg ) once daily for the next 2 weeks then increase to 50mg  once daily.  ? ?Continue Lisinopril 5mg  daily.  ? ?Restart metoprolol 50mg  once daily. ? ? ?

## 2022-02-10 NOTE — Progress Notes (Signed)
?HPI with pertinent ROS:  ? ?CC: HTN follow up ? ?HPI: ?Pleasant 76 year old female presenting today to follow up on HTN. Was prescribed Metoprolol 50mg  twice daily and Valsartan 40mg  daily. Unable to tolerate Losartan due to jittery feelings that she associated with the medication.  Today presents reporting that she is no longer taking metoprolol and never started the valsartan.  She has been taking lisinopril that she got from her son.  It was medication that he was previously prescribed and no longer taking so he gave it to her.  She gradually went up in dose until she is on lisinopril 5 mg daily, tolerating well without significant side effects.  Has been checking her blood pressure, see list scanned under media. Denies CP, SOB, palpitations, lower extremity edema, dizziness, headaches, or vision changes. ? ?Was instructed by Dr. to increase her levothyroxine to 50 mcg/day since her TSH level was greater than 4.  She and her husband note that they wanted to get my opinion on this before they made any changes.  Currently she is still taking 25 mg daily.  Having issues with multiple symptoms including body aches and pains, brain fog, memory loss, and fatigue.  Feels that all of her symptoms are related to Hashimoto's rather than psychiatric concerns.   ? ?I reviewed the past medical history, family history, social history, surgical history, and allergies today and no changes were needed.  Please see the problem list section below in epic for further details. ? ? ?Physical exam:  ? ?General: Well Developed, well nourished, and in no acute distress.  ?Neuro: Alert and oriented x3.  ?HEENT: Normocephalic, atraumatic.  ?Skin: Warm and dry. ?Cardiac: Regular rate and rhythm, no murmurs rubs or gallops, no lower extremity edema.  ?Respiratory: Clear to auscultation bilaterally. Not using accessory muscles, speaking in full sentences. ? ?Impression and Recommendations:   ? ?1. Primary hypertension ?Blood pressure is  not at goal although there are a couple of readings that look pretty good.  Since she is now taking lisinopril and tolerating it, continue lisinopril 5 mg daily.  Adding back metoprolol 50 mg once daily but we will do this as the extended release rather than requiring a twice daily dosing.  Advised that we will need to continue monitoring her blood pressure with a goal of 130/80 or less. ?- lisinopril (ZESTRIL) 5 MG tablet; Take 1 tablet (5 mg total) by mouth daily.  Dispense: 90 tablet; Refill: 3 ?- metoprolol succinate (TOPROL XL) 50 MG 24 hr tablet; Take 1 tablet (50 mg total) by mouth daily. Take with or immediately following a meal.  Dispense: 30 tablet; Refill: 0 ? ?2. Hashimoto's thyroiditis ?Reviewed her most recent labs.  Her TSH was 4.35 so we do need to increase her levothyroxine.  Since they are very hesitant about changing medications advised to take 37.5 mg daily for the next week and 1/2 to 2 weeks and then increase to 50 mg daily.  We will need to recheck her thyroid in 6 weeks to see how she is responding to this. ? ?3.  Anxiety with depression ?Today, she has been hesitant to take any medication that is mind altering.  We did briefly discuss Cymbalta which was recommended by Dr. at his appointment.  She is still hesitant and would like to think on this.  Reports that she is doing much better than she was previously although her PHQ-9/GAD-7 numbers do not reflect this. ? ?Return in about 2 weeks (around  02/24/2022) for nurse visit for BP check. ?___________________________________________ ?Thayer Ohm, DNP, APRN, FNP-BC ?Primary Care and Sports Medicine ?[facility] ?

## 2022-02-28 ENCOUNTER — Ambulatory Visit: Payer: Medicare Other | Admitting: Family Medicine

## 2022-03-08 ENCOUNTER — Ambulatory Visit (INDEPENDENT_AMBULATORY_CARE_PROVIDER_SITE_OTHER): Payer: Medicare Other | Admitting: Sports Medicine

## 2022-03-08 DIAGNOSIS — G5603 Carpal tunnel syndrome, bilateral upper limbs: Secondary | ICD-10-CM

## 2022-03-08 DIAGNOSIS — E063 Autoimmune thyroiditis: Secondary | ICD-10-CM | POA: Diagnosis not present

## 2022-03-08 NOTE — Assessment & Plan Note (Signed)
Currently on 44 mcg of TSH,(88 mcg tab cut in half) and most recent TSH that they obtained on their own 02/27/2022 was 1.95, we can leave her levothyroxine dosing alone. Of note CMP was also normal with the exception of hyperglycemia at 131.

## 2022-03-08 NOTE — Assessment & Plan Note (Signed)
This pleasant 76 year old female returns, she has had a long history of bilateral hand pain with numbness and tingling as well as a nerve conduction and EMG that showed severe bilateral carpal tunnel syndrome without radiculopathy. I did a bilateral median nerve hydrodissection and she returns today with dramatic symptom improvement and very happy with results. As her thyroid continues to remain controlled I think her symptoms will continue to improve and we can do this again as needed.

## 2022-03-08 NOTE — Progress Notes (Signed)
    Procedures performed today:    None.  Independent interpretation of notes and tests performed by another provider:   None.  Brief History, Exam, Impression, and Recommendations:    Carpal tunnel syndrome, bilateral This pleasant 76 year old female returns, she has had a long history of bilateral hand pain with numbness and tingling as well as a nerve conduction and EMG that showed severe bilateral carpal tunnel syndrome without radiculopathy. I did a bilateral median nerve hydrodissection and she returns today with dramatic symptom improvement and very happy with results. As her thyroid continues to remain controlled I think her symptoms will continue to improve and we can do this again as needed.  Hashimoto's thyroiditis Currently on 44 mcg of TSH,(88 mcg tab cut in half) and most recent TSH that they obtained on their own 02/27/2022 was 1.95, we can leave her levothyroxine dosing alone. Of note CMP was also normal with the exception of hyperglycemia at 131.    ___________________________________________ Ihor Austin. Benjamin Stain, M.D., ABFM., CAQSM. Primary Care and Sports Medicine [facility]  Adjunct Instructor of Family Medicine  [employer]

## 2022-05-23 NOTE — Progress Notes (Signed)
Office Visit Note  Patient: Nina Kirby             Date of Birth: 1946/02/22           MRN: [id number]             PCP: Samuel Bouche, NP Referring: Samuel Bouche, NP Visit Date: 06/05/2022 Occupation: @GUAROCC @  Subjective:  Pain and tingling in both hands  History of Present Illness: Nina Kirby is a 76 y.o. female seen in consultation per request of her PCP.  According to the patient She was in perfect health until September 24, 2021 when she had a syncopal episode when she fell.  She had 2 syncopal episodes and she went to the emergency room.  She states she had extensive work-up and after that she was diagnosed with hypothyroidism.  She was started on Synthroid.  She had no recurrence of syncopal episodes since then.  She complains states that she started having discomfort in her cervical spine.  She was evaluated by Dr. Dianah Field and had cervical spine x-rays.  She was also referred to a neurologist who diagnosed her with carpal tunnel syndrome.  She states she was having severe pain and discomfort in her bilateral hands until she had bilateral carpal tunnel injections by Dr. Dianah Field on January 25, 2021.  She states she had remarkable improvement after the injections.  Some of the discomfort is coming back.  She is not interested in having carpal tunnel release.  She continues to have stiffness in her hands.  None of the other joints are painful.  She states she had recent x-rays of her hands which were consistent with osteoarthritis.  I do not have the results available.  She was told that she had osteoarthritis in her knee joints but she does not want any further work-up.  She denies any history of oral ulcers, nasal ulcers, malar rash, photosensitivity, Raynaud's phenomenon or lymphadenopathy.  She gives history of dry mouth which she relates to medication use.  There is no family history of autoimmune disease.  She is gravida 4, para 4.  Activities of Daily Living:  Patient  reports morning stiffness for 0  none .   Patient Denies nocturnal pain.  Difficulty dressing/grooming: Denies Difficulty climbing stairs: Reports Difficulty getting out of chair: Denies Difficulty using hands for taps, buttons, cutlery, and/or writing: Reports  Review of Systems  Constitutional:  Positive for fatigue.  HENT:  Positive for mouth dryness. Negative for mouth sores.   Eyes:  Positive for dryness.  Respiratory:  Negative for shortness of breath.   Cardiovascular:  Negative for chest pain and palpitations.  Gastrointestinal:  Negative for blood in stool, constipation and diarrhea.  Endocrine: Negative for increased urination.  Genitourinary:  Positive for involuntary urination.  Musculoskeletal:  Positive for joint pain, gait problem, joint pain, myalgias and myalgias. Negative for joint swelling, muscle weakness, morning stiffness and muscle tenderness.  Skin:  Positive for hair loss. Negative for color change, rash and sensitivity to sunlight.  Allergic/Immunologic: Negative for susceptible to infections.  Neurological:  Positive for dizziness and headaches.  Hematological:  Negative for swollen glands.  Psychiatric/Behavioral:  Positive for depressed mood and sleep disturbance. The patient is not nervous/anxious.     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[place]) 11/11/2021  Hypothyroidism due to Hashimoto's thyroiditis 11/10/2021   Hyperlipemia 01/06/2016   Prediabetes 01/06/2016   Elevated TSH 01/06/2016   HTN (hypertension) 01/03/2016   Right knee pain 01/03/2016   Plantar fasciitis of right foot 01/03/2016   Vitamin D deficiency 01/03/2016   Hashimoto's thyroiditis 01/03/2016    Past Medical History:  Diagnosis Date   Carpal tunnel syndrome    H/O renal  calculi    right   Hashimoto's disease    Hypertension    Renal calculi    3 days ago    Family History  Problem Relation Age of Onset   Hashimoto's thyroiditis Mother    Hashimoto's thyroiditis Sister    Hashimoto's thyroiditis Sister    Hashimoto's thyroiditis Sister    Hashimoto's thyroiditis Brother    Past Surgical History:  Procedure Laterality Date   CHOLECYSTECTOMY     Normal Stess test  09/05/2012   Normal Nuclear Stress test. 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  Constitutional:      Appearance: She is well-developed.  HENT:     Head: Normocephalic and atraumatic.  Eyes:     Conjunctiva/sclera: Conjunctivae normal.  Cardiovascular:     Rate and Rhythm: Normal rate and regular rhythm.     Heart sounds: Normal heart sounds.  Pulmonary:     Effort: Pulmonary effort is normal.     Breath sounds: Normal breath sounds.  Abdominal:     General: Bowel sounds are normal.     Palpations: Abdomen is soft.  Musculoskeletal:     Cervical back: Normal range of motion.  Lymphadenopathy:     Cervical: No cervical adenopathy.  Skin:    General: Skin is warm and dry.     Capillary Refill: Capillary refill takes less than 2 seconds.  Neurological:     Mental Status: She is alert and oriented to person, place, and time.  Psychiatric:        Behavior: Behavior normal.      Musculoskeletal Exam: C-spine was in good range of motion.  She had thoracic kyphosis.   Shoulder joints, elbow joints, wrist joints with good range of motion.  She had bilateral DIP thickening more prominent in her left hand.  She is left-handed.  Hip joints with good range of motion.  She had limited extension of bilateral knee joints without any warmth swelling or effusion.  There was no tenderness over ankles or MTPs.  No synovitis was noted.  CDAI Exam: CDAI Score: -- Patient Global: --; Provider Global: -- Swollen: --; Tender: -- Joint Exam 06/05/2022   No joint exam has been documented for this visit   There is currently no information documented on the homunculus. Go to the Rheumatology activity and complete the homunculus joint exam.  Investigation: No additional findings.  Imaging: No results found.  Recent Labs: Lab Results  Component Value Date   WBC 4.0 12/11/2020  HGB 14.0 12/11/2020   PLT 100 (L) 12/11/2020   NA 139 12/11/2020   K 4.0 12/11/2020   CL 106 12/11/2020   CO2 24 12/11/2020   GLUCOSE 116 (H) 12/11/2020   BUN 14 12/11/2020   CREATININE 0.81 12/11/2020   BILITOT 0.6 12/11/2020   ALKPHOS 101 01/03/2016   AST 34 12/11/2020   ALT 32 (H) 12/11/2020   PROT 8.2 (H) 12/11/2020   ALBUMIN 4.4 01/03/2016   CALCIUM 10.0 12/11/2020   GFRAA 82 12/11/2020   December 22, 2021 CBC normal, CMP normal except elevated calcium.  ANA positive, RF negative, ASO negative, IgG elevated, IFE showed polyclonal increase detected in 1 or more immunoglobulins  Speciality Comments: No specialty comments available.  Procedures:  No procedures performed Allergies: Azithromycin, Iodinated contrast media, Codeine, Lisinopril-hydrochlorothiazide, and Hydrocodone   Assessment / Plan:     Visit Diagnoses: Bilateral hand pain-patient states that she started having pain in her bilateral hands due to carpal tunnel syndrome.  After she had carpal tunnel syndrome injections by Dr. Dianah Field the symptoms relieved.  She has some recurrence of symptoms.  Detailed counsel  regarding carpal tunnel syndrome was provided.  I discussed possibility of carpal tunnel release but patient is not interested in having surgery.  Bilateral hand swelling-she states she had a lot of discomfort and possible swelling in her hands prior to the cortisone injections.  I do not see any synovitis on the examination.  She had bilateral DIP thickening more prominent in her left hand.  She is left-handed.  Clinical findings are consistent with osteoarthritis.  I offered x-rays of bilateral hands for evaluation but patient declined.  She states she had recent x-rays.  I also reviewed her labs from December 22, 2021 which showed positive ANA, RF negative.  Uric acid was 6.0 on October 06, 2021.  I advised her to contact me if she develops any new symptoms or increased swelling.  Joint protection muscle strengthening was discussed.  Carpal tunnel syndrome, bilateral-I reviewed records from Dr. Dianah Field.  She had nerve conduction velocities which showed severe bilateral carpal tunnel syndrome.  She had good response to cortisone injection given to her by Dr. Dianah Field on January 25, 2021.  Patient does not want to proceed with carpal tunnel release.  She also does not like taking medications.  Arthralgia of both knees-she had limited extension of her bilateral knee joints.  She has some difficulty with mobility.  I offered x-rays of her bilateral knee joints but she declined.  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  Need for regular exercise and stretching was emphasized.  Positive ANA (antinuclear antibody)-she had positive ANA on December 22, 2021 but no titer was given.  There is no history of oral ulcers, nasal ulcers, malar rash, photosensitivity, Raynaud's phenomenon or lymphadenopathy.  Primary hypertension-she is on Zestril 5 mg p.o. daily.  Blood pressure is 193/114.  White coat syndrome with diagnosis of  hypertension-patient and her husband reported that she has whitecoat syndrome.  Her blood pressure is normal at home.  Mixed hyperlipidemia-she is not taking any medications.  Prediabetes  Cerebral amyloid angiopathy ([facility])-I reviewed records from her neurologist.  Advised to have follow-up visit with the neurologist.  Hypothyroidism due to Hashimoto's thyroiditis-patient is on Synthroid.  Her TSH is normal now.  Vitamin D deficiency-she is currently not taking vitamin D.  Anxiety and depression-patient was offered Cymbalta by her  PCP which she declined.  Tremor-patient is concerned about tremors in her hands.  She had fine tremors.  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  Advised her to discuss that further with the neurologist.    Orders: No orders of the defined types were placed in this encounter.  No orders of the defined types were placed in this encounter.   Face-to-face time spent with patient was 45 minutes. Greater than 50% of time was spent in counseling and coordination of care.  Follow-Up Instructions: Return if symptoms worsen or fail to improve.   Pollyann Savoy, MD  Note - This record has been created using Animal nutritionist.  Chart creation errors have been sought, but may not always  have been located. Such creation errors do not reflect on  the standard of medical care.

## 2022-06-05 ENCOUNTER — Ambulatory Visit: Payer: Medicare Other | Attending: Rheumatology | Admitting: Rheumatology

## 2022-06-05 ENCOUNTER — Encounter: Payer: Self-pay | Admitting: Rheumatology

## 2022-06-05 VITALS — BP 193/114 | HR 84 | Resp 17 | Ht 65.0 in | Wt 183.0 lb

## 2022-06-05 DIAGNOSIS — M79642 Pain in left hand: Secondary | ICD-10-CM

## 2022-06-05 DIAGNOSIS — R251 Tremor, unspecified: Secondary | ICD-10-CM

## 2022-06-05 DIAGNOSIS — M25561 Pain in right knee: Secondary | ICD-10-CM | POA: Diagnosis not present

## 2022-06-05 DIAGNOSIS — R7303 Prediabetes: Secondary | ICD-10-CM

## 2022-06-05 DIAGNOSIS — E854 Organ-limited amyloidosis: Secondary | ICD-10-CM

## 2022-06-05 DIAGNOSIS — R768 Other specified abnormal immunological findings in serum: Secondary | ICD-10-CM

## 2022-06-05 DIAGNOSIS — M79641 Pain in right hand: Secondary | ICD-10-CM

## 2022-06-05 DIAGNOSIS — E038 Other specified hypothyroidism: Secondary | ICD-10-CM

## 2022-06-05 DIAGNOSIS — G5603 Carpal tunnel syndrome, bilateral upper limbs: Secondary | ICD-10-CM

## 2022-06-05 DIAGNOSIS — M722 Plantar fascial fibromatosis: Secondary | ICD-10-CM

## 2022-06-05 DIAGNOSIS — I680 Cerebral amyloid angiopathy: Secondary | ICD-10-CM

## 2022-06-05 DIAGNOSIS — M7989 Other specified soft tissue disorders: Secondary | ICD-10-CM | POA: Diagnosis not present

## 2022-06-05 DIAGNOSIS — E782 Mixed hyperlipidemia: Secondary | ICD-10-CM

## 2022-06-05 DIAGNOSIS — E559 Vitamin D deficiency, unspecified: Secondary | ICD-10-CM

## 2022-06-05 DIAGNOSIS — I10 Essential (primary) hypertension: Secondary | ICD-10-CM

## 2022-06-05 DIAGNOSIS — M25562 Pain in left knee: Secondary | ICD-10-CM

## 2022-06-05 DIAGNOSIS — F32A Depression, unspecified: Secondary | ICD-10-CM

## 2022-06-05 DIAGNOSIS — F419 Anxiety disorder, unspecified: Secondary | ICD-10-CM

## 2022-06-05 DIAGNOSIS — E063 Autoimmune thyroiditis: Secondary | ICD-10-CM

## 2022-06-07 ENCOUNTER — Encounter: Payer: Self-pay | Admitting: General Practice

## 2022-06-27 ENCOUNTER — Ambulatory Visit: Payer: Medicare Other | Admitting: Rheumatology

## 2022-12-15 ENCOUNTER — Ambulatory Visit (INDEPENDENT_AMBULATORY_CARE_PROVIDER_SITE_OTHER): Payer: Medicare Other | Admitting: Medical-Surgical

## 2022-12-15 ENCOUNTER — Encounter: Payer: Self-pay | Admitting: Medical-Surgical

## 2022-12-15 VITALS — BP 220/91 | HR 88

## 2022-12-15 DIAGNOSIS — R7303 Prediabetes: Secondary | ICD-10-CM | POA: Diagnosis not present

## 2022-12-15 DIAGNOSIS — Z13220 Encounter for screening for lipoid disorders: Secondary | ICD-10-CM | POA: Diagnosis not present

## 2022-12-15 DIAGNOSIS — Z0000 Encounter for general adult medical examination without abnormal findings: Secondary | ICD-10-CM

## 2022-12-15 DIAGNOSIS — I10 Essential (primary) hypertension: Secondary | ICD-10-CM

## 2022-12-15 DIAGNOSIS — E063 Autoimmune thyroiditis: Secondary | ICD-10-CM

## 2022-12-15 DIAGNOSIS — R5383 Other fatigue: Secondary | ICD-10-CM

## 2022-12-15 DIAGNOSIS — E038 Other specified hypothyroidism: Secondary | ICD-10-CM

## 2022-12-15 DIAGNOSIS — E782 Mixed hyperlipidemia: Secondary | ICD-10-CM

## 2022-12-15 DIAGNOSIS — E559 Vitamin D deficiency, unspecified: Secondary | ICD-10-CM

## 2022-12-15 MED ORDER — LISINOPRIL 5 MG PO TABS: 5.0000 mg | ORAL_TABLET | Freq: Every day | ORAL | 3 refills | Status: DC

## 2022-12-15 MED ORDER — LISINOPRIL 5 MG PO TABS: 5.0000 mg | ORAL_TABLET | Freq: Two times a day (BID) | ORAL | 3 refills | Status: DC

## 2022-12-15 MED ORDER — LEVOTHYROXINE SODIUM 25 MCG PO TABS: 25.0000 ug | ORAL_TABLET | Freq: Every day | ORAL | 1 refills | Status: DC

## 2022-12-15 NOTE — Progress Notes (Signed)
Complete physical exam  Patient: Nina Kirby   DOB: [birth year]   77 y.o. Female  MRN: [id number]  Subjective:    No chief complaint on file.   Nina Kirby is a 77 y.o. female who presents today for a complete physical exam. She reports consuming a general diet.  Walking every day.  She generally feels fairly well. She reports sleeping well. She does not have additional problems to discuss today.    Most recent fall risk assessment:    02/10/2022    9:25 AM  Fall Risk   Falls in the past year? 1  Number falls in past yr: 0  Injury with Fall? 1  Risk for fall due to : History of fall(s)  Follow up Falls evaluation completed     Most recent depression screenings:    02/10/2022    9:25 AM 01/11/2021    3:10 PM  PHQ 2/9 Scores  PHQ - 2 Score 6 0  PHQ- 9 Score 21     Vision:Not within last year  and Dental: No current dental problems and Receives regular dental care    Patient Care Team: Samuel Bouche, NP as PCP - General (Nurse Practitioner)   Outpatient Medications Prior to Visit  Medication Sig   Acetaminophen (TYLENOL 8 HOUR PO) Take by mouth.   [DISCONTINUED] levothyroxine (SYNTHROID) 25 MCG tablet Take 25 mcg by mouth daily.   [DISCONTINUED] lisinopril (ZESTRIL) 5 MG tablet Take 1 tablet (5 mg total) by mouth daily.   No facility-administered medications prior to visit.    Review of Systems  Constitutional:  Positive for malaise/fatigue. Negative for chills, fever and weight loss.  HENT:  Negative for congestion, ear pain, hearing loss, sinus pain and sore throat.   Eyes:  Negative for blurred vision, photophobia and pain.  Respiratory:  Negative for cough, shortness of breath and wheezing.   Cardiovascular:  Negative for chest pain, palpitations and leg swelling.  Gastrointestinal:  Negative for abdominal pain, constipation, diarrhea, heartburn, nausea and vomiting.  Genitourinary:  Negative for dysuria, frequency and urgency.  Musculoskeletal:   Negative for falls and neck pain.  Skin:  Negative for itching and rash.  Neurological:  Negative for dizziness, weakness and headaches.  Endo/Heme/Allergies:  Negative for polydipsia. Does not bruise/bleed easily.  Psychiatric/Behavioral:  Negative for depression, substance abuse and suicidal ideas. The patient is not nervous/anxious and does not have insomnia.      Objective:    BP (!) 220/91   Pulse 88   SpO2 95%    Physical Exam Vitals reviewed.  Constitutional:      General: She is not in acute distress.    Appearance: Normal appearance. She is not ill-appearing.  HENT:     Head: Normocephalic and atraumatic.     Right Ear: Tympanic membrane, ear canal and external ear normal. There is no impacted cerumen.     Left Ear: Tympanic membrane, ear canal and external ear normal. There is no impacted cerumen.     Nose: Nose normal. No congestion or rhinorrhea.     Mouth/Throat:     Mouth: Mucous membranes are moist.     Pharynx: No oropharyngeal exudate or posterior oropharyngeal erythema.  Eyes:     General: No scleral icterus.       Right eye: No discharge.        Left eye: No discharge.     Extraocular Movements: Extraocular movements intact.     Conjunctiva/sclera: Conjunctivae normal.  Pupils: Pupils are equal, round, and reactive to light.  Neck:     Thyroid: No thyromegaly.     Vascular: No carotid bruit or JVD.     Trachea: Trachea normal.  Cardiovascular:     Rate and Rhythm: Normal rate and regular rhythm.     Pulses: Normal pulses.     Heart sounds: Normal heart sounds. No murmur heard.    No friction rub. No gallop.  Pulmonary:     Effort: Pulmonary effort is normal. No respiratory distress.     Breath sounds: Normal breath sounds. No wheezing.  Abdominal:     General: Bowel sounds are normal. There is no distension.     Palpations: Abdomen is soft.     Tenderness: There is no abdominal tenderness. There is no guarding.  Musculoskeletal:        General:  Normal range of motion.     Cervical back: Normal range of motion and neck supple.  Lymphadenopathy:     Cervical: No cervical adenopathy.  Skin:    General: Skin is warm and dry.  Neurological:     Mental Status: She is alert and oriented to person, place, and time.     Cranial Nerves: No cranial nerve deficit.  Psychiatric:        Mood and Affect: Mood normal.        Behavior: Behavior normal.        Thought Content: Thought content normal.        Judgment: Judgment normal.      No results found for any visits on 12/15/22.     Assessment & Plan:    Routine Health Maintenance and Physical Exam  Immunization History  Administered Date(s) Administered   Janssen (J&J) SARS-COV-2 Vaccination 06/25/2020, 10/14/2020   Pneumococcal Polysaccharide-23 03/16/2021   Tdap 01/03/2016    Health Maintenance  Topic Date Due   COVID-19 Vaccine (3 - 2023-24 season) 12/31/2022 (Originally 06/16/2022)   Medicare Annual Wellness (AWV)  01/15/2023 (Originally 06/01/2022)   Zoster Vaccines- Shingrix (1 of 2) 03/17/2023 (Originally 01/03/1965)   Pneumonia Vaccine 64+ Years old (2 of 2 - PCV) 12/15/2023 (Originally 03/16/2022)   DEXA SCAN  03/02/2024 (Originally 01/02/2009)   Hepatitis C Screening  03/02/2024 (Originally 01/02/1962)   INFLUENZA VACCINE  12/05/2028 (Originally 05/16/2022)   DTaP/Tdap/Td (2 - Td or Tdap) 01/02/2026   HPV VACCINES  Aged Out    Discussed health benefits of physical activity, and encouraged her to engage in regular exercise appropriate for her age and condition.  1. Annual physical exam Checking labs as below.  Recommend updating vision and dental care.  Wellness information provided with AVS. - CBC with Differential/Platelet - COMPLETE METABOLIC PANEL WITH GFR - Lipid panel  2. Lipid screening Checking lipids today. - Lipid panel  3. Prediabetes Checking hemoglobin A1c today. - Hemoglobin A1c  4. Mixed hyperlipidemia Checking lipids.  5. Hypothyroidism due  to Hashimoto's thyroiditis Checking TSH.  Continue levothyroxine 25 g daily. - TSH  6. Primary hypertension Checking labs.  Blood pressure significantly elevated on arrival however she does have known whitecoat hypertension in the setting of primary hypertension.  Home readings have been around 150/80 which is still elevated.  Discussed medication and she is willing to try lisinopril 5 mg twice daily rather than taking 10 mg all at the same time.  Plan to reach out to her via phone in 2 weeks to see how her blood pressures have been doing with the increased dose of  medication and if she is tolerating it well. - CBC with Differential/Platelet - COMPLETE METABOLIC PANEL WITH GFR - Lipid panel - lisinopril (ZESTRIL) 5 MG tablet; Take 1 tablet (5 mg total) by mouth in the morning and at bedtime.  Dispense: 180 tablet; Refill: 3  7. Vitamin D deficiency Checking vitamin D. - VITAMIN D 25 Hydroxy (Vit-D Deficiency, Fractures)  8. Fatigue, unspecified type Checking labs as below. - CBC with Differential/Platelet - COMPLETE METABOLIC PANEL WITH GFR - VITAMIN D 25 Hydroxy (Vit-D Deficiency, Fractures) - Vitamin B12 - Iron, TIBC and Ferritin Panel  Return in about 6 months (around 06/17/2023) for HTN follow up.   Samuel Bouche, NP

## 2022-12-16 LAB — CBC WITH DIFFERENTIAL/PLATELET
Absolute Monocytes: 447 cells/uL (ref 200–950)
Basophils Absolute: 21 cells/uL (ref 0–200)
Basophils Relative: 0.4 %
Eosinophils Absolute: 78 cells/uL (ref 15–500)
Eosinophils Relative: 1.5 %
HCT: 41.1 % (ref 35.0–45.0)
Hemoglobin: 13.6 g/dL (ref 11.7–15.5)
Lymphs Abs: 1576 cells/uL (ref 850–3900)
MCH: 30.7 pg (ref 27.0–33.0)
MCHC: 33.1 g/dL (ref 32.0–36.0)
MCV: 92.8 fL (ref 80.0–100.0)
MPV: 10.8 fL (ref 7.5–12.5)
Monocytes Relative: 8.6 %
Neutro Abs: 3078 cells/uL (ref 1500–7800)
Neutrophils Relative %: 59.2 %
Platelets: 226 10*3/uL (ref 140–400)
RBC: 4.43 10*6/uL (ref 3.80–5.10)
RDW: 12.4 % (ref 11.0–15.0)
Total Lymphocyte: 30.3 %
WBC: 5.2 10*3/uL (ref 3.8–10.8)

## 2022-12-16 LAB — LIPID PANEL
Cholesterol: 176 mg/dL (ref <200)
HDL: 41 mg/dL — ABNORMAL LOW (ref >=50)
LDL Cholesterol (Calc): 98 mg/dL (calc)
Non-HDL Cholesterol (Calc): 135 mg/dL (calc) — ABNORMAL HIGH (ref <130)
Total CHOL/HDL Ratio: 4.3 (calc) (ref <5.0)
Triglycerides: 243 mg/dL — ABNORMAL HIGH (ref <150)

## 2022-12-16 LAB — IRON,TIBC AND FERRITIN PANEL
%SAT: 23 % (calc) (ref 16–45)
Ferritin: 97 ng/mL (ref 16–288)
Iron: 87 ug/dL (ref 45–160)
TIBC: 372 mcg/dL (calc) (ref 250–450)

## 2022-12-16 LAB — COMPLETE METABOLIC PANEL WITH GFR
AG Ratio: 1.2 (calc) (ref 1.0–2.5)
ALT: 11 U/L (ref 6–29)
AST: 17 U/L (ref 10–35)
Albumin: 4.8 g/dL (ref 3.6–5.1)
Alkaline phosphatase (APISO): 94 U/L (ref 37–153)
BUN/Creatinine Ratio: 19 (calc) (ref 6–22)
BUN: 20 mg/dL (ref 7–25)
CO2: 24 mmol/L (ref 20–32)
Calcium: 10.4 mg/dL (ref 8.6–10.4)
Chloride: 107 mmol/L (ref 98–110)
Creat: 1.05 mg/dL — ABNORMAL HIGH (ref 0.60–1.00)
Globulin: 3.9 g/dL (calc) — ABNORMAL HIGH (ref 1.9–3.7)
Glucose, Bld: 112 mg/dL — ABNORMAL HIGH (ref 65–99)
Potassium: 4.2 mmol/L (ref 3.5–5.3)
Sodium: 140 mmol/L (ref 135–146)
Total Bilirubin: 0.3 mg/dL (ref 0.2–1.2)
Total Protein: 8.7 g/dL — ABNORMAL HIGH (ref 6.1–8.1)
eGFR: 54 mL/min/{1.73_m2} — ABNORMAL LOW (ref >=60)

## 2022-12-16 LAB — HEMOGLOBIN A1C
Hgb A1c MFr Bld: 6.4 % of total Hgb — ABNORMAL HIGH (ref <5.7)
Mean Plasma Glucose: 137 mg/dL
eAG (mmol/L): 7.6 mmol/L

## 2022-12-16 LAB — TSH: TSH: 5.57 mIU/L — ABNORMAL HIGH (ref 0.40–4.50)

## 2022-12-16 LAB — VITAMIN D 25 HYDROXY (VIT D DEFICIENCY, FRACTURES): Vit D, 25-Hydroxy: 34 ng/mL (ref 30–100)

## 2022-12-16 LAB — VITAMIN B12: Vitamin B-12: 440 pg/mL (ref 200–1100)

## 2022-12-25 ENCOUNTER — Telehealth: Payer: Self-pay | Admitting: General Practice

## 2022-12-25 NOTE — Transitions of Care (Post Inpatient/ED Visit) (Signed)
   12/25/2022  Name: Nina Kirby MRN: [id number] DOB: 09/07/1946  Today's TOC FU Call Status: Today's TOC FU Call Status:: Successful TOC FU Call Competed TOC FU Call Complete Date: 12/25/22  Transition Care Management Follow-up Telephone Call Date of Discharge: 12/24/22 Discharge Facility: Other ([place]) Name of Other ([facility]) Discharge Facility: [facility] Type of Discharge: Emergency Department Reason for ED Visit: Cardiac Conditions Cardiac Conditions Diagnosis:  (hypertension) How have you been since you were released from the hospital?: Better Any questions or concerns?: No  Items Reviewed: Did you receive and understand the discharge instructions provided?: No Medications obtained and verified?: No Medications Not Reviewed Reasons:: Other: (patient did not want to go over medications) Any new allergies since your discharge?: No Dietary orders reviewed?: NA Do you have support at home?: Yes  Home Care and Equipment/Supplies: [place] Ordered?: No Any new equipment or medical supplies ordered?: No  Functional Questionnaire: Do you need assistance with bathing/showering or dressing?: No Do you need assistance with meal preparation?: No Do you need assistance with eating?: No Do you have difficulty maintaining continence: No Do you need assistance with getting out of bed/getting out of a chair/moving?: No Do you have difficulty managing or taking your medications?: No  Folllow up appointments reviewed: PCP Follow-up appointment confirmed?: No MD Provider Line [phone] Given:  (Patient did not want to schedule appt at this time.) [facility] Follow-up appointment confirmed?: NA Do you need transportation to your follow-up appointment?: No Do you understand care options if your condition(s) worsen?: Yes-patient verbalized understanding    SIGNATURE: Tinnie Gens, RN

## 2022-12-26 ENCOUNTER — Ambulatory Visit (INDEPENDENT_AMBULATORY_CARE_PROVIDER_SITE_OTHER): Payer: Medicare Other | Admitting: Medical-Surgical

## 2022-12-26 ENCOUNTER — Encounter: Payer: Self-pay | Admitting: Medical-Surgical

## 2022-12-26 VITALS — BP 180/100 | HR 108 | Wt 181.0 lb

## 2022-12-26 DIAGNOSIS — I10 Essential (primary) hypertension: Secondary | ICD-10-CM | POA: Diagnosis not present

## 2022-12-26 DIAGNOSIS — Z09 Encounter for follow-up examination after completed treatment for conditions other than malignant neoplasm: Secondary | ICD-10-CM

## 2022-12-26 DIAGNOSIS — E063 Autoimmune thyroiditis: Secondary | ICD-10-CM | POA: Diagnosis not present

## 2022-12-26 DIAGNOSIS — E038 Other specified hypothyroidism: Secondary | ICD-10-CM

## 2022-12-26 MED ORDER — LIOTHYRONINE SODIUM 5 MCG PO TABS: 2.5000 ug | ORAL_TABLET | Freq: Every day | ORAL | 1 refills | Status: DC

## 2022-12-26 NOTE — Progress Notes (Signed)
Established Patient Office Visit  Subjective   Patient ID: Nina Kirby, female   DOB: [birth year] Age: 77 y.o. MRN: [id number]   Chief Complaint  Patient presents with   Follow-up   Hypertension   HPI Pleasant 77 year old female accompanied by her husband presenting today for hospital discharge follow-up.  She has been monitoring her blood pressure at home and noted that her blood pressure started to run extremely high over the weekend.  She is aware that dangerously high blood pressures should prompt a visit to the emergency room so they proceeded to the emergency department at [facility].  She was evaluated there for hypertensive urgency.  She was given hydralazine IV x 1 which did seem to help somewhat however her blood pressure remained significantly elevated.  She reports that she was discharged with the understanding that she should go home and monitor her blood pressure as they understood that it would not come down while she was in the hospital.  She was also instructed that she could take up to 40 mg of lisinopril in 1 day and that she should take 5 mg doses every hour or so until her blood pressure was better controlled.  Since then, she has been following instructions and is averaging between 10 and 30 mg daily.  She seems to be tolerating the medication as long as it is in small increments throughout the day.  Home blood pressures have continued to run a little higher than recommended however she does have some readings with her today as follows: 120/74 146/83 140/87 142/82  Also notes that she got my message regarding levothyroxine and they have tried to increase her dose to 25 mg twice daily to see if this is better tolerated.  Unfortunately, her dose seems to give her diarrhea within 1 hour of taking it and she starts to feel not like herself.  She has tried multiple things in the past with levothyroxine but she just cannot seem to handle more than  25 mcg daily.  Does note continued fatigue.  Open to other options for treatment of thyroid issues.  Also notes that they wonder if the thyroid medication increase could have corresponded to her elevations in blood pressure.  Objective:    Vitals:   12/26/22 1546  BP: (!) 180/100  Pulse: (!) 108  Weight: 181 lb (82.1 kg)  SpO2: 94%   Physical Exam Vitals reviewed.  Constitutional:      General: She is not in acute distress.    Appearance: Normal appearance. She is obese. She is not ill-appearing.  HENT:     Head: Normocephalic and atraumatic.  Cardiovascular:     Rate and Rhythm: Normal rate and regular rhythm.     Pulses: Normal pulses.     Heart sounds: Normal heart sounds.  Pulmonary:     Effort: Pulmonary effort is normal. No respiratory distress.     Breath sounds: Normal breath sounds. No wheezing, rhonchi or rales.  Skin:    General: Skin is warm and dry.  Neurological:     Mental Status: She is alert and oriented to person, place, and time.  Psychiatric:        Mood and Affect: Mood normal.        Behavior: Behavior normal.        Thought Content: Thought content normal.        Judgment: Judgment normal.   No results found for this or any previous visit (from the  past 24 hour(s)).     The 10-year ASCVD risk score (Arnett DK, et al., 2019) is: 47.1%   Values used to calculate the score:     Age: 27 years     Sex: Female     Is Non-Hispanic African American: No     Diabetic: No     Tobacco smoker: No     Systolic Blood Pressure: 99991111 mmHg     Is BP treated: Yes     HDL Cholesterol: 41 mg/dL     Total Cholesterol: 176 mg/dL   Assessment & Plan:   1. Hospital discharge follow-up 2. Primary hypertension Reviewed hospital documentation and recommendations. She has historically been intolerant to multiple blood pressure medications. BP elevated today per usual white coat hypertension issues. Manual BP checked, 160/98. Continue Lisinopril with titration for BP  under 140/90.   3. Hypothyroidism due to Hashimoto's thyroiditis Reduce levothyroxine to 25 mcg daily.  Adding liothyronine 2.5 mg daily to see if this is better tolerated and provides some benefit to fatigue and thyroid numbers.  Return in about 2 weeks (around 01/09/2023) for HTN follow up.  ___________________________________________ Clearnce Sorrel, DNP, APRN, FNP-BC Primary Care and [place]

## 2022-12-29 ENCOUNTER — Telehealth: Payer: Self-pay | Admitting: General Practice

## 2022-12-29 NOTE — Transitions of Care (Post Inpatient/ED Visit) (Signed)
   12/29/2022  Name: Nina Kirby MRN: [id number] DOB: [birth year]  Today's TOC FU Call Status: TOC FU Call Complete Date: 12/29/22  Transition Care Management Follow-up Telephone Call Date of Discharge: 12/28/22 Discharge Facility: Other ([place]) Name of Other ([facility]) Discharge Facility: [facility] Type of Discharge: Emergency Department Reason for ED Visit: Cardiac Conditions Cardiac Conditions Diagnosis:  (Hypertension) How have you been since you were released from the hospital?: Better Any questions or concerns?: No  Items Reviewed: Did you receive and understand the discharge instructions provided?: Yes Medications obtained and verified?: Yes (Medications Reviewed) Any new allergies since your discharge?: No Dietary orders reviewed?: No Do you have support at home?: Yes  Home Care and Equipment/Supplies: [place] Ordered?: NA Any new equipment or medical supplies ordered?: NA  Functional Questionnaire: Do you need assistance with bathing/showering or dressing?: No Do you need assistance with meal preparation?: No Do you need assistance with eating?: No Do you have difficulty maintaining continence: No Do you need assistance with getting out of bed/getting out of a chair/moving?: No Do you have difficulty managing or taking your medications?: No  Folllow up appointments reviewed: PCP Follow-up appointment confirmed?: No MD Provider Line Number:[phone] Given:  (Patient has a follow up scheduled already) [facility] Follow-up appointment confirmed?: NA Do you need transportation to your follow-up appointment?: No Do you understand care options if your condition(s) worsen?: Yes-patient verbalized understanding    SIGNATURE: Tinnie Gens, RN BSN

## 2023-01-09 ENCOUNTER — Ambulatory Visit: Payer: Medicare Other | Admitting: Medical-Surgical

## 2023-01-17 ENCOUNTER — Telehealth: Payer: Self-pay | Admitting: Medical-Surgical

## 2023-01-17 NOTE — Telephone Encounter (Signed)
Contacted Nina Kirby to schedule their annual wellness visit. Appointment made for 01/30/2023 at 10am.  *([name])*

## 2023-01-18 IMAGING — DX DG CERVICAL SPINE COMPLETE 4+V
7 series · 7 of 7 positions shown · non-contrast
Comparison: None.

CLINICAL DATA: Chronic neck pain.

EXAM:
CERVICAL SPINE - COMPLETE 4+ VIEW

[c-spine lat (1 of 2)]
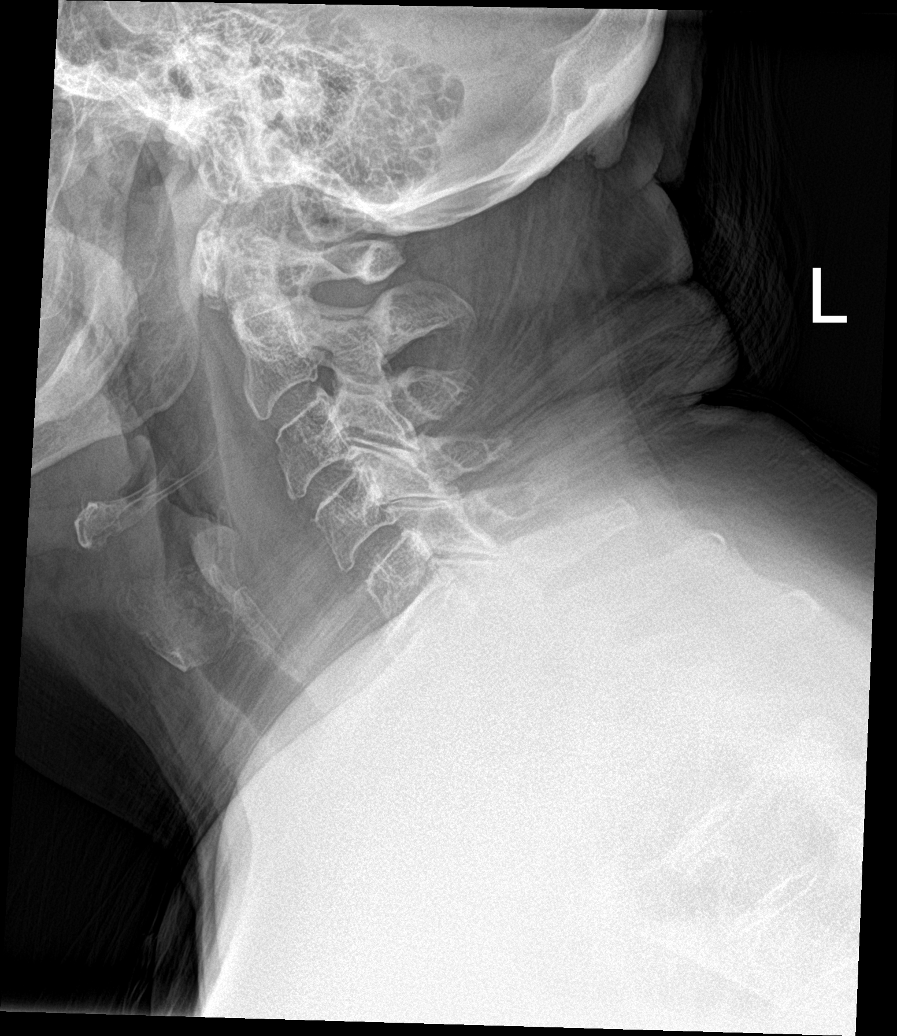

[c-spine obl (1 of 2)]
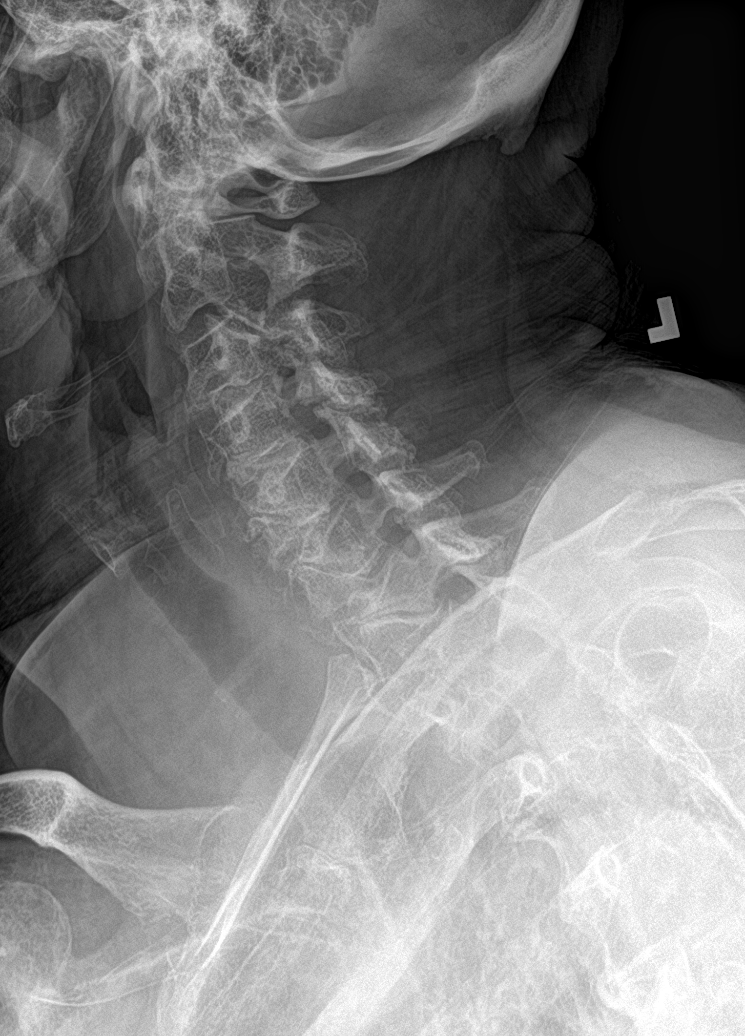

[c-spine obl (2 of 2)]
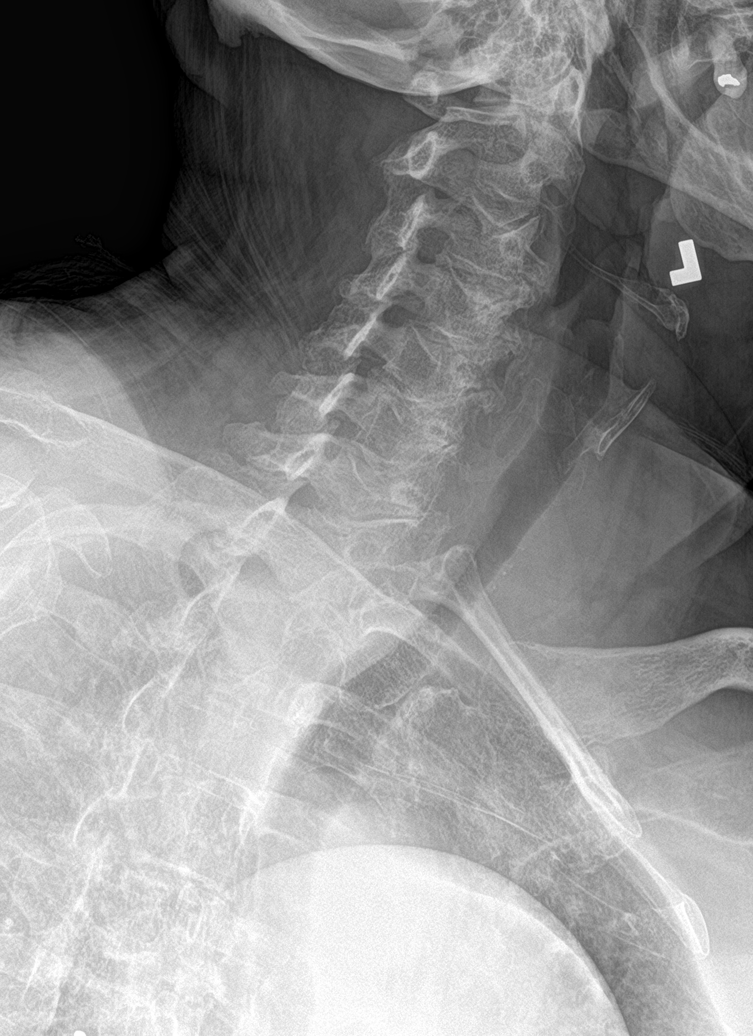

[c-spine ap]
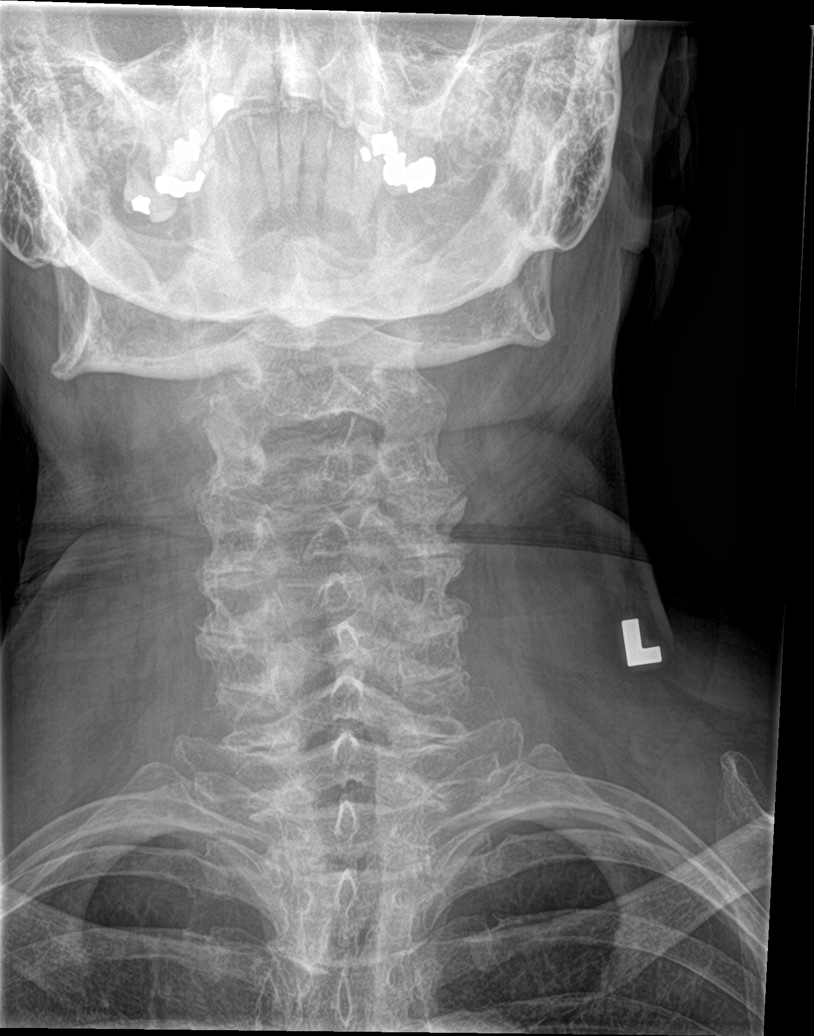

[c-spine open mouth]
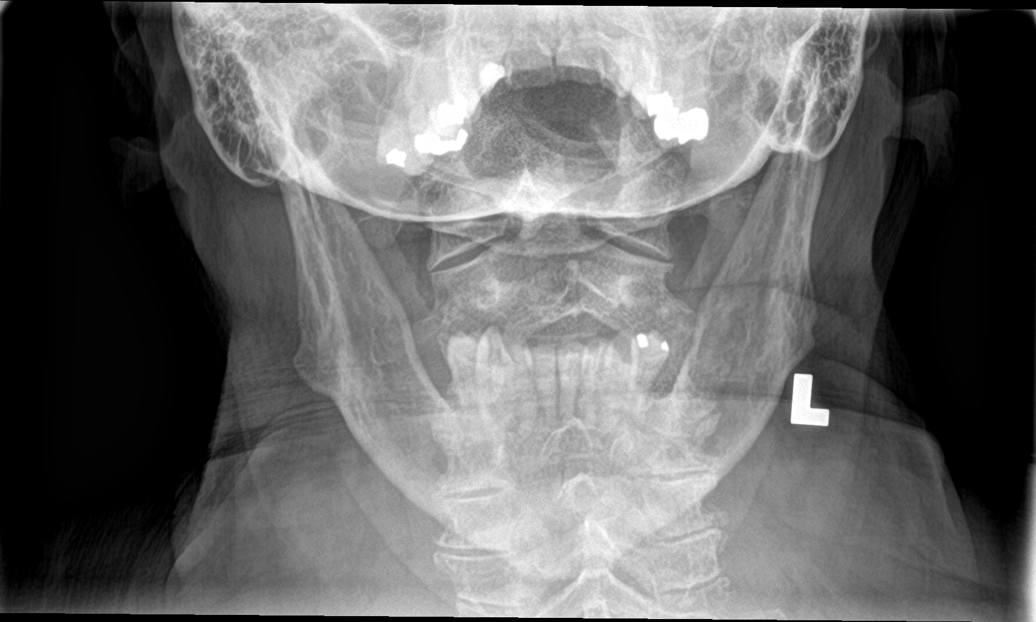

[c-spine swimmers]
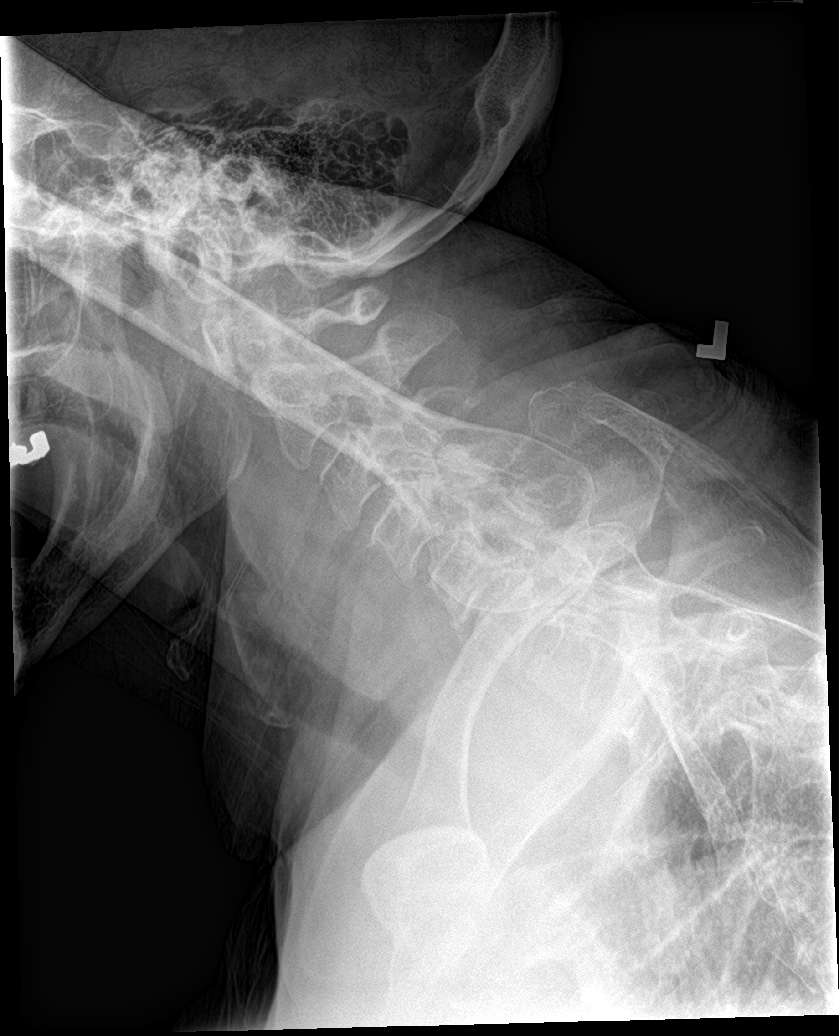

[c-spine lat (2 of 2)]
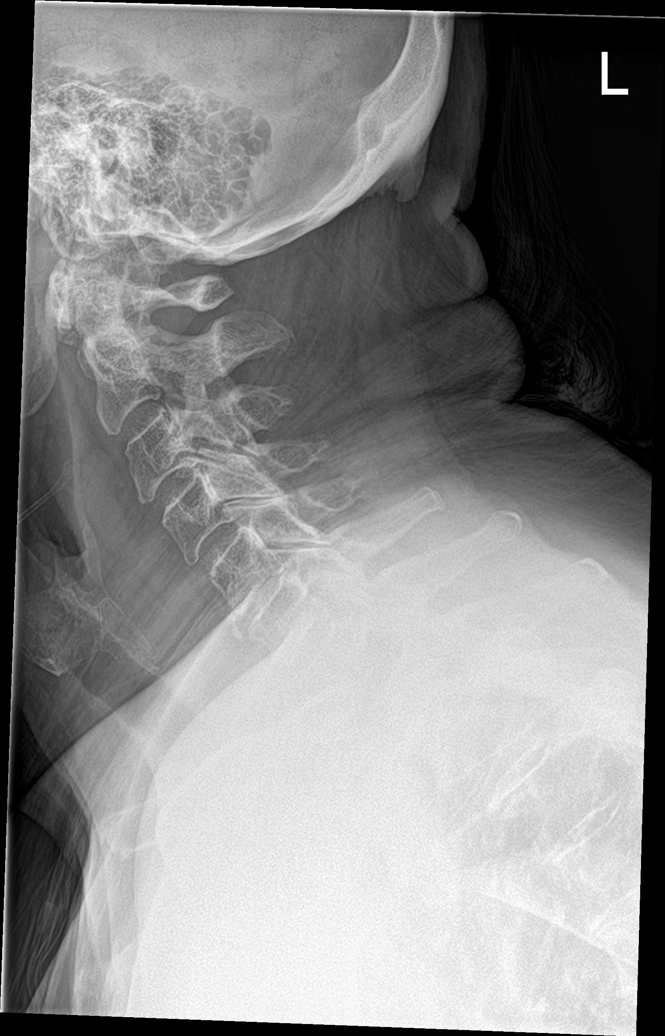

[7 of 7 positions shown; findings below may reference images not displayed]

FINDINGS: No recent fracture is seen. C7 vertebra is not optimally visualized
in the lateral view. In the AP and oblique views no significant
abnormality is noted in the C7 vertebra. There is mild encroachment
of left neural foramina by uncovertebral spurs and facet hypertrophy
at C3-C4 and C4-C5 levels. There is mild encroachment of right
neural foramina at C3-C4 and C5-C6 levels. Prevertebral soft tissues
appear more prominent than usual. Significance of this finding is
not clear. There is no significant narrowing of airways.
IMPRESSION: No recent fracture is seen. There is encroachment of neural foramina
from C3-C6 levels as described in the body of the report.

Prevertebral soft tissues appear more prominent than usual.
Significance of this finding is not clear. If clinically warranted,
follow-up CT may be considered.

## 2023-01-30 ENCOUNTER — Encounter: Payer: Self-pay | Admitting: Medical-Surgical

## 2023-01-30 ENCOUNTER — Ambulatory Visit (INDEPENDENT_AMBULATORY_CARE_PROVIDER_SITE_OTHER): Payer: Medicare Other | Admitting: Medical-Surgical

## 2023-01-30 VITALS — BP 151/84 | HR 74 | Temp 99.1°F | Ht 65.0 in | Wt 180.8 lb

## 2023-01-30 DIAGNOSIS — E063 Autoimmune thyroiditis: Secondary | ICD-10-CM | POA: Diagnosis not present

## 2023-01-30 DIAGNOSIS — I10 Essential (primary) hypertension: Secondary | ICD-10-CM | POA: Diagnosis not present

## 2023-01-30 DIAGNOSIS — E038 Other specified hypothyroidism: Secondary | ICD-10-CM

## 2023-01-30 LAB — TSH: TSH: 1.7 mIU/L (ref 0.40–4.50)

## 2023-01-30 NOTE — Assessment & Plan Note (Signed)
Previous TSH levels indicate a need for increase of her levothyroxine for optimal management of hypothyroidism.  She had difficulty tolerating a one-time daily dose however she has been using levothyroxine 12.5 mcg 3 times daily and this is better tolerated.  Has been on this regimen for 4-6 weeks and would like to have her thyroid levels rechecked.  Rechecking TSH today.

## 2023-01-30 NOTE — Progress Notes (Signed)
        Established patient visit  History, exam, impression, and plan:  Hypothyroidism due to Hashimoto's thyroiditis Previous TSH levels indicate a need for increase of her levothyroxine for optimal management of hypothyroidism.  She had difficulty tolerating a one-time daily dose however she has been using levothyroxine 12.5 mcg 3 times daily and this is better tolerated.  Has been on this regimen for 4-6 weeks and would like to have her thyroid levels rechecked.  Rechecking TSH today.  HTN (hypertension) Pleasant 77 year old female presenting today with chronic history of uncontrolled hypertension.  She is currently taking lisinopril 5 mg daily and monitoring her blood pressure at home.  Notes that her home readings have been better than they were although they are still outside of the goal.  Her blood pressure yesterday was 170s/80s which she feels is her normal.  Has had multiple episodes of intolerances/side effects to blood pressure medications and is hesitant to make any significant changes.  Discussed use of lisinopril.  Would recommend increasing this to 5 mg twice daily however she feels that she is doing better managing it once daily with a second dose if her blood pressure is significantly uncontrolled.  After our last visit, she did have an ED visit for chest heaviness/tightness but this resolved and she has had no further issues.  Discussed guideline recommendations for optimal heart health and she is able to verbalize the understanding that we would like her blood pressure at 130/80 or less.  With this knowledge, she prefers to continue managing it as it is currently and will monitor for any concerning symptoms.  On exam, HRRR, S1-S2 normal.  No peripheral edema.  Lungs CTA.  Respirations even and unlabored.  Continue lisinopril as prescribed.  Procedures performed this visit: None.  Return in about 3 months (around 05/01/2023) for chronic disease follow  up.  __________________________________ Thayer Ohm, DNP, APRN, FNP-BC Primary Care and Sports Medicine [facility] [place]

## 2023-01-30 NOTE — Assessment & Plan Note (Signed)
Pleasant 77 year old female presenting today with chronic history of uncontrolled hypertension.  She is currently taking lisinopril 5 mg daily and monitoring her blood pressure at home.  Notes that her home readings have been better than they were although they are still outside of the goal.  Her blood pressure yesterday was 170s/80s which she feels is her normal.  Has had multiple episodes of intolerances/side effects to blood pressure medications and is hesitant to make any significant changes.  Discussed use of lisinopril.  Would recommend increasing this to 5 mg twice daily however she feels that she is doing better managing it once daily with a second dose if her blood pressure is significantly uncontrolled.  After our last visit, she did have an ED visit for chest heaviness/tightness but this resolved and she has had no further issues.  Discussed guideline recommendations for optimal heart health and she is able to verbalize the understanding that we would like her blood pressure at 130/80 or less.  With this knowledge, she prefers to continue managing it as it is currently and will monitor for any concerning symptoms.  On exam, HRRR, S1-S2 normal.  No peripheral edema.  Lungs CTA.  Respirations even and unlabored.  Continue lisinopril as prescribed.

## 2023-01-31 ENCOUNTER — Ambulatory Visit: Payer: Medicare Other

## 2023-02-13 ENCOUNTER — Ambulatory Visit (INDEPENDENT_AMBULATORY_CARE_PROVIDER_SITE_OTHER): Payer: Medicare Other | Admitting: Medical-Surgical

## 2023-02-13 DIAGNOSIS — Z0000 Encounter for general adult medical examination without abnormal findings: Secondary | ICD-10-CM

## 2023-02-13 NOTE — Progress Notes (Signed)
MEDICARE ANNUAL WELLNESS VISIT  02/13/2023  Telephone Visit Disclaimer This Medicare AWV was conducted by telephone due to national recommendations for restrictions regarding the COVID-19 Pandemic (e.g. social distancing).  I verified, using two identifiers, that I am speaking with Nina Kirby or their authorized healthcare agent. I discussed the limitations, risks, security, and privacy concerns of performing an evaluation and management service by telephone and the potential availability of an in-person appointment in the future. The patient expressed understanding and agreed to proceed.  Location of Patient: Home Location of Provider (nurse):  In the office.  Subjective:    Nina Kirby is a 77 y.o. female patient of Christen Butter, NP who had a Medicare Annual Wellness Visit today via telephone. Nina Kirby is Retired and lives with their spouse and grandson. she has 4 children. she reports that she is socially active and does interact with friends/family regularly. she is moderately physically active and enjoys gardening.  Patient Care Team: Christen Butter, NP as PCP - General (Nurse Practitioner)     02/13/2023    3:04 PM 01/30/2023    3:18 PM 01/11/2021    3:09 PM  Advanced Directives  Does Patient Have a Medical Advance Directive? Yes Yes Yes  Type of Advance Directive Living will  Healthcare Power of [place];Living will  Does patient want to make changes to medical advance directive? No - Patient declined  No - Patient declined  Copy of Healthcare Power of Attorney in Chart?   No - copy requested    Hospital Utilization Over the Past 12 Months: # of hospitalizations or ER visits: 2 # of surgeries: 0  Review of Systems    Patient reports that her overall health is better compared to last year.  History obtained from chart review and the patient  Patient Reported Readings (BP, Pulse, CBG, Weight, etc) none  Pain Assessment Pain : No/denies pain     Current  Medications & Allergies (verified) Allergies as of 02/13/2023       Reactions   Azithromycin Swelling, Anaphylaxis   Oral swelling   Iodinated Contrast Media Anaphylaxis   Codeine Other (See Comments)   Jitters    Lisinopril-hydrochlorothiazide Other (See Comments), Cough   Mental status changes, jaw pain   Hydrocodone Other (See Comments)        Medication List        Accurate as of February 13, 2023  3:15 PM. If you have any questions, ask your nurse or doctor.          levothyroxine 25 MCG tablet Commonly known as: SYNTHROID Take 1 tablet (25 mcg total) by mouth daily.   lisinopril 5 MG tablet Commonly known as: ZESTRIL Take 1 tablet (5 mg total) by mouth in the morning and at bedtime.   TYLENOL 8 HOUR PO Take by mouth.        History (reviewed): Past Medical History:  Diagnosis Date   Carpal tunnel syndrome    H/O renal calculi    right   Hashimoto's disease    Hypertension    Renal calculi    3 days ago   Past Surgical History:  Procedure Laterality Date   CHOLECYSTECTOMY     Normal Stess test  09/05/2012   Normal Nuclear Stress test. See scanned document   TONSILLECTOMY     TUBAL LIGATION     Family History  Problem Relation Age of Onset   Hashimoto's thyroiditis Mother    Hashimoto's thyroiditis Sister    Hashimoto's  thyroiditis Sister    Hashimoto's thyroiditis Sister    Hashimoto's thyroiditis Brother    Social History   Socioeconomic History   Marital status: Married    Spouse name: Ramon Dredge   Number of children: 4   Years of education: 12   Highest education level: 12th grade  Occupational History   Occupation: Retired  Tobacco Use   Smoking status: Never    Passive exposure: Past   Smokeless tobacco: Never  Vaping Use   Vaping Use: Never used  Substance and Sexual Activity   Alcohol use: Yes    Comment: rarely   Drug use: Yes    Comment: [place] 8   Sexual activity: Not on file  Other Topics Concern   Not on file  Social  History Narrative   Lives with her spouse and her grandson. She has four children. She enjoys gardening.   Social Determinants of Health   Financial Resource Strain: Low Risk  (02/13/2023)   Overall Financial Resource Strain (CARDIA)    Difficulty of Paying Living Expenses: Not hard at all  Food Insecurity: No Food Insecurity (02/13/2023)   Hunger Vital Sign    Worried About Running Out of Food in the Last Year: Never true    Ran Out of Food in the Last Year: Never true  Transportation Needs: No Transportation Needs (02/13/2023)   PRAPARE - Administrator, Civil Service (Medical): No    Lack of Transportation (Non-Medical): No  Physical Activity: Inactive (02/13/2023)   Exercise Vital Sign    Days of Exercise per Week: 0 days    Minutes of Exercise per Session: 0 min  Stress: No Stress Concern Present (02/13/2023)   [employer] of Occupational Health - Occupational Stress Questionnaire    Feeling of Stress : Not at all  Social Connections: Moderately Isolated (02/13/2023)   Social Connection and Isolation Panel [NHANES]    Frequency of Communication with Friends and Family: Never    Frequency of Social Gatherings with Friends and Family: More than three times a week    Attends Religious Services: Never    Database administrator or Organizations: No    Attends Banker Meetings: Never    Marital Status: Married    Activities of Daily Living    02/13/2023    3:08 PM  In your present state of health, do you have any difficulty performing the following activities:  Hearing? 0  Vision? 0  Difficulty concentrating or making decisions? 1  Walking or climbing stairs? 0  Dressing or bathing? 0  Doing errands, shopping? 0  Preparing Food and eating ? N  Using the Toilet? N  In the past six months, have you accidently leaked urine? N  Do you have problems with loss of bowel control? N  Managing your Medications? N  Managing your Finances? N  Housekeeping  or managing your Housekeeping? N    Patient Education/ Literacy How often do you need to have someone help you when you read instructions, pamphlets, or other written materials from your doctor or pharmacy?: 1 - Never What is the last grade level you completed in school?: 12th grade  Exercise Current Exercise Habits: Home exercise routine, Type of exercise: walking, Time (Minutes): 60, Frequency (Times/Week): 5, Weekly Exercise (Minutes/Week): 300, Intensity: Mild, Exercise limited by: None identified  Diet Patient reports consuming 3 meals a day and 1-2 snack(s) a day Patient reports that her primary diet is: Regular Patient reports that she does  have regular access to food.   Depression Screen    02/13/2023    3:04 PM 01/30/2023    3:17 PM 12/26/2022    3:50 PM 02/10/2022    9:25 AM 01/11/2021    3:10 PM  PHQ 2/9 Scores  PHQ - 2 Score 0 0 0 6 0  PHQ- 9 Score    21      Fall Risk    02/13/2023    3:04 PM 01/30/2023    3:17 PM 12/26/2022    3:50 PM 02/10/2022    9:25 AM 01/10/2022   11:34 AM  Fall Risk   Falls in the past year? 0 0 0 1 1  Number falls in past yr: 0 0 0 0 1  Injury with Fall? 0 0 0 1 0  Risk for fall due to : No Fall Risks No Fall Risks No Fall Risks History of fall(s) History of fall(s)  Follow up Falls evaluation completed Falls evaluation completed Falls evaluation completed Falls evaluation completed Falls evaluation completed     Objective:  Nina Kirby seemed alert and oriented and she participated appropriately during our telephone visit.  Blood Pressure Weight BMI  BP Readings from Last 3 Encounters:  01/30/23 (!) 151/84  12/26/22 (!) 180/100  12/15/22 (!) 220/91   Wt Readings from Last 3 Encounters:  01/30/23 180 lb 12.8 oz (82 kg)  12/26/22 181 lb (82.1 kg)  06/05/22 183 lb (83 kg)   BMI Readings from Last 1 Encounters:  01/30/23 30.09 kg/m    *Unable to obtain current vital signs, weight, and BMI due to telephone visit  type  Hearing/Vision  Nina Kirby did not seem to have difficulty with hearing/understanding during the telephone conversation Reports that she has not had a formal eye exam by an eye care professional within the past year Reports that she has not had a formal hearing evaluation within the past year *Unable to fully assess hearing and vision during telephone visit type  Cognitive Function:    02/13/2023    3:11 PM  6CIT Screen  What Year? 0 points  What month? 0 points  What time? 0 points  Count back from 20 0 points  Months in reverse 4 points  Repeat phrase 0 points  Total Score 4 points   (Normal:0-7, Significant for Dysfunction: >8)  Normal Cognitive Function Screening: Yes   Immunization & Health Maintenance Record Immunization History  Administered Date(s) Administered   Janssen (J&J) SARS-COV-2 Vaccination 06/25/2020, 10/14/2020   Pneumococcal Polysaccharide-23 03/16/2021   Tdap 01/03/2016    Health Maintenance  Topic Date Due   COVID-19 Vaccine (3 - 2023-24 season) 03/01/2023 (Originally 06/16/2022)   Zoster Vaccines- Shingrix (1 of 2) 03/17/2023 (Originally 01/03/1965)   Pneumonia Vaccine 78+ Years old (2 of 2 - PCV) 12/15/2023 (Originally 03/16/2022)   DEXA SCAN  03/02/2024 (Originally 01/02/2009)   Hepatitis C Screening  03/02/2024 (Originally 01/02/1964)   INFLUENZA VACCINE  12/05/2028 (Originally 05/17/2023)   Medicare Annual Wellness (AWV)  02/13/2024   DTaP/Tdap/Td (2 - Td or Tdap) 01/02/2026   HPV VACCINES  Aged Out       Assessment  This is a routine wellness examination for Nina Kirby.  Health Maintenance: Due or Overdue There are no preventive care reminders to display for this patient.   Nina Kirby does not need a referral for Community Assistance: Care Management:   no Social Work:    no Prescription Assistance:  no Nutrition/Diabetes Education:  no   Plan:  Personalized Goals  Goals Addressed               This Visit's Progress      Patient Stated (pt-stated)        Patient stated that she would like to get healthier.       Personalized Health Maintenance & Screening Recommendations  Pneumococcal vaccine  Bone densitometry screening Shingrix vaccine  - declined  Declined bone density scan  Lung Cancer Screening Recommended: no (Low Dose CT Chest recommended if Age 19-80 years, 30 pack-year currently smoking OR have quit w/in past 15 years) Hepatitis C Screening recommended: yes HIV Screening recommended: no  Advanced Directives: Written information was not prepared per patient's request.  Referrals & Orders No orders of the defined types were placed in this encounter.   Follow-up Plan Follow-up with Christen Butter, NP as planned Medicare wellness visit in one year. AVS printed and mailed to the patient.   I have personally reviewed and noted the following in the patient's chart:   Medical and social history Use of alcohol, tobacco or illicit drugs  Current medications and supplements Functional ability and status Nutritional status Physical activity Advanced directives List of other physicians Hospitalizations, surgeries, and ER visits in previous 12 months Vitals Screenings to include cognitive, depression, and falls Referrals and appointments  In addition, I have reviewed and discussed with Nina Kirby certain preventive protocols, quality metrics, and best practice recommendations. A written personalized care plan for preventive services as well as general preventive health recommendations is available and can be mailed to the patient at her request.      Modesto Charon, RN BSN  02/13/2023

## 2023-02-13 NOTE — Patient Instructions (Addendum)
MEDICARE ANNUAL WELLNESS VISIT Health Maintenance Summary and Written Plan of Care  Ms. Nina Kirby ,  Thank you for allowing me to perform your Medicare Annual Wellness Visit and for your ongoing commitment to your health.   Health Maintenance & Immunization History Health Maintenance  Topic Date Due   COVID-19 Vaccine (3 - 2023-24 season) 03/01/2023 (Originally 06/16/2022)   Zoster Vaccines- Shingrix (1 of 2) 03/17/2023 (Originally 01/03/1965)   Pneumonia Vaccine 79+ Years old (2 of 2 - PCV) 12/15/2023 (Originally 03/16/2022)   DEXA SCAN  03/02/2024 (Originally 01/02/2009)   Hepatitis C Screening  03/02/2024 (Originally 01/02/1964)   INFLUENZA VACCINE  12/05/2028 (Originally 05/17/2023)   Medicare Annual Wellness (AWV)  02/13/2024   DTaP/Tdap/Td (2 - Td or Tdap) 01/02/2026   HPV VACCINES  Aged Out   Immunization History  Administered Date(s) Administered   Comptroller (J&J) SARS-COV-2 Vaccination 06/25/2020, 10/14/2020   Pneumococcal Polysaccharide-23 03/16/2021   Tdap 01/03/2016    These are the patient goals that we discussed:  Goals Addressed               This Visit's Progress     Patient Stated (pt-stated)        Patient stated that she would like to get healthier.         This is a list of Health Maintenance Items that are overdue or due now: Pneumococcal vaccine  Bone densitometry screening - declined Shingrix vaccine - declined    Orders/Referrals Placed Today: No orders of the defined types were placed in this encounter.  (Contact our referral department at [phone] if you have not spoken with someone about your referral appointment within the next 5 days)    Follow-up Plan Follow-up with Christen Butter, NP as planned Medicare wellness visit in one year. AVS printed and mailed to the patient.      Health Maintenance, Female Adopting a healthy lifestyle and getting preventive care are important in promoting health and wellness. Ask your health care provider  about: The right schedule for you to have regular tests and exams. Things you can do on your own to prevent diseases and keep yourself healthy. What should I know about diet, weight, and exercise? Eat a healthy diet  Eat a diet that includes plenty of vegetables, fruits, low-fat dairy products, and lean protein. Do not eat a lot of foods that are high in solid fats, added sugars, or sodium. Maintain a healthy weight Body mass index (BMI) is used to identify weight problems. It estimates body fat based on height and weight. Your health care provider can help determine your BMI and help you achieve or maintain a healthy weight. Get regular exercise Get regular exercise. This is one of the most important things you can do for your health. Most adults should: Exercise for at least 150 minutes each week. The exercise should increase your heart rate and make you sweat (moderate-intensity exercise). Do strengthening exercises at least twice a week. This is in addition to the moderate-intensity exercise. Spend less time sitting. Even light physical activity can be beneficial. Watch cholesterol and blood lipids Have your blood tested for lipids and cholesterol at 79 years of age, then have this test every 5 years. Have your cholesterol levels checked more often if: Your lipid or cholesterol levels are high. You are older than 79 years of age. You are at high risk for heart disease. What should I know about cancer screening? Depending on your health history and family history, you may  need to have cancer screening at various ages. This may include screening for: Breast cancer. Cervical cancer. Colorectal cancer. Skin cancer. Lung cancer. What should I know about heart disease, diabetes, and high blood pressure? Blood pressure and heart disease High blood pressure causes heart disease and increases the risk of stroke. This is more likely to develop in people who have high blood pressure readings  or are overweight. Have your blood pressure checked: Every 3-5 years if you are 15-29 years of age. Every year if you are 70 years old or older. Diabetes Have regular diabetes screenings. This checks your fasting blood sugar level. Have the screening done: Once every three years after age 63 if you are at a normal weight and have a low risk for diabetes. More often and at a younger age if you are overweight or have a high risk for diabetes. What should I know about preventing infection? Hepatitis B If you have a higher risk for hepatitis B, you should be screened for this virus. Talk with your health care provider to find out if you are at risk for hepatitis B infection. Hepatitis C Testing is recommended for: Everyone born from [birth year] through 1967. Anyone with known risk factors for hepatitis C. Sexually transmitted infections (STIs) Get screened for STIs, including gonorrhea and chlamydia, if: You are sexually active and are younger than 79 years of age. You are older than 79 years of age and your health care provider tells you that you are at risk for this type of infection. Your sexual activity has changed since you were last screened, and you are at increased risk for chlamydia or gonorrhea. Ask your health care provider if you are at risk. Ask your health care provider about whether you are at high risk for HIV. Your health care provider may recommend a prescription medicine to help prevent HIV infection. If you choose to take medicine to prevent HIV, you should first get tested for HIV. You should then be tested every 3 months for as long as you are taking the medicine. Pregnancy If you are about to stop having your period (premenopausal) and you may become pregnant, seek counseling before you get pregnant. Take 400 to 800 micrograms (mcg) of folic acid every day if you become pregnant. Ask for birth control (contraception) if you want to prevent pregnancy. Osteoporosis and  menopause Osteoporosis is a disease in which the bones lose minerals and strength with aging. This can result in bone fractures. If you are 13 years old or older, or if you are at risk for osteoporosis and fractures, ask your health care provider if you should: Be screened for bone loss. Take a calcium or vitamin D supplement to lower your risk of fractures. Be given hormone replacement therapy (HRT) to treat symptoms of menopause. Follow these instructions at home: Alcohol use Do not drink alcohol if: Your health care provider tells you not to drink. You are pregnant, may be pregnant, or are planning to become pregnant. If you drink alcohol: Limit how much you have to: 0-1 drink a day. Know how much alcohol is in your drink. In the [place] one drink equals one 12 oz bottle of beer (355 mL), one 5 oz glass of wine (148 mL), or one 1 oz glass of hard liquor (44 mL). Lifestyle Do not use any products that contain nicotine or tobacco. These products include cigarettes, chewing tobacco, and vaping devices, such as e-cigarettes. If you need help quitting, ask your health care  provider. Do not use street drugs. Do not share needles. Ask your health care provider for help if you need support or information about quitting drugs. General instructions Schedule regular health, dental, and eye exams. Stay current with your vaccines. Tell your health care provider if: You often feel depressed. You have ever been abused or do not feel safe at home. Summary Adopting a healthy lifestyle and getting preventive care are important in promoting health and wellness. Follow your health care provider's instructions about healthy diet, exercising, and getting tested or screened for diseases. Follow your health care provider's instructions on monitoring your cholesterol and blood pressure. This information is not intended to replace advice given to you by your health care provider. Make sure you discuss any  questions you have with your health care provider. Document Revised: 02/21/2021 Document Reviewed: 02/21/2021 Elsevier Patient Education  2023 [employer].

## 2023-02-27 ENCOUNTER — Other Ambulatory Visit: Payer: Self-pay | Admitting: Medical-Surgical

## 2023-05-01 ENCOUNTER — Encounter: Payer: Self-pay | Admitting: Medical-Surgical

## 2023-05-01 ENCOUNTER — Ambulatory Visit (INDEPENDENT_AMBULATORY_CARE_PROVIDER_SITE_OTHER): Payer: Medicare Other | Admitting: Medical-Surgical

## 2023-05-01 VITALS — BP 168/80 | HR 83 | Resp 20 | Ht 65.0 in | Wt 184.1 lb

## 2023-05-01 DIAGNOSIS — E782 Mixed hyperlipidemia: Secondary | ICD-10-CM | POA: Diagnosis not present

## 2023-05-01 DIAGNOSIS — E038 Other specified hypothyroidism: Secondary | ICD-10-CM | POA: Diagnosis not present

## 2023-05-01 DIAGNOSIS — E063 Autoimmune thyroiditis: Secondary | ICD-10-CM

## 2023-05-01 DIAGNOSIS — I10 Essential (primary) hypertension: Secondary | ICD-10-CM

## 2023-05-01 MED ORDER — THYROID 30 MG PO TABS: 15.0000 mg | ORAL_TABLET | Freq: Every day | ORAL | 0 refills | Status: DC

## 2023-05-01 MED ORDER — LISINOPRIL 5 MG PO TABS: 5.0000 mg | ORAL_TABLET | Freq: Two times a day (BID) | ORAL | 3 refills | Status: DC

## 2023-05-01 NOTE — Progress Notes (Signed)
        Established patient visit  History, exam, impression, and plan:  1. Primary hypertension Pleasant 77 year old female accompanied by her husband presenting with a history of uncontrolled hypertension.  She is currently taking lisinopril 5 mg daily although if her blood pressure is running significantly high at home, she will take an extra dose or 2 of lisinopril 5 mg to be able to get it under control.  Has not been regularly monitoring blood pressure at home although she does have a cuff available.  Reports that she is able to tell when her blood pressure is high due to physical symptoms.  Still has a little bit of salt to foods.  No regular intentional exercise.  Has tried multiple medications to manage blood pressure in the past and has not found any that is tolerated as well as the lisinopril.  Unfortunately, her blood pressure is still not at goal today despite it being significantly improved from previous readings.  Concern for cardiovascular complications and long-term damage from uncontrolled blood pressure.  Strongly recommend monitoring blood pressure at home with a goal of 130/80 or less.  Would like her to check her blood pressure 2-3 times weekly so that we have a good basis for her ambulatory readings since she does have a history of whitecoat syndrome. - lisinopril (ZESTRIL) 5 MG tablet; Take 1 tablet (5 mg total) by mouth in the morning and at bedtime.  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  Unfortunately her TSH has been creeping up.  She has been taking levothyroxine 12.5 mg 3 times daily as she is unable to take the full dose in the morning.  Even taking it this way, she has significant nausea and GI upset.  Her last TSH was finally at goal and she has been doing well overall.  No concerning symptoms today.  At this point, she is having difficulty maintaining the 3 times  daily dosing and is interested in options.  We did briefly discuss switching to NP thyroid or Armour Thyroid to see if this is better tolerated.  She is agreeable so sending NP thyroid 15 mg daily to the pharmacy.  Plan to check TSH in 6-8 weeks on new medication if this is well-tolerated.  If not, advised her to reach out to let us know and we can come up with a different plan. - TSH  3. Mixed hyperlipidemia History of hyperlipidemia not currently managed by any medications.  She is working on a low-fat heart healthy diet.  Not interested in starting statin medication for cholesterol management.  Procedures performed this visit: None.  Return for TSH recheck in 6-8 weeks.  Lab order placed already.  __________________________________ Thayer Ohm, DNP, APRN, FNP-BC Primary Care and Sports Medicine [facility] [place]

## 2023-05-24 ENCOUNTER — Ambulatory Visit (INDEPENDENT_AMBULATORY_CARE_PROVIDER_SITE_OTHER): Payer: Medicare Other | Admitting: Medical-Surgical

## 2023-05-24 ENCOUNTER — Encounter: Payer: Self-pay | Admitting: Medical-Surgical

## 2023-05-24 VITALS — BP 150/98 | HR 102 | Ht 67.0 in | Wt 172.0 lb

## 2023-05-24 DIAGNOSIS — E063 Autoimmune thyroiditis: Secondary | ICD-10-CM | POA: Diagnosis not present

## 2023-05-24 DIAGNOSIS — E038 Other specified hypothyroidism: Secondary | ICD-10-CM

## 2023-05-24 DIAGNOSIS — J3489 Other specified disorders of nose and nasal sinuses: Secondary | ICD-10-CM

## 2023-05-24 DIAGNOSIS — R6889 Other general symptoms and signs: Secondary | ICD-10-CM

## 2023-05-24 MED ORDER — LEVOTHYROXINE SODIUM 25 MCG PO TABS: 37.5000 ug | ORAL_TABLET | Freq: Every day | ORAL | 1 refills | Status: DC

## 2023-05-24 NOTE — Progress Notes (Signed)
        Established patient visit  History, exam, impression, and plan:  1. Sinus drainage Pleasant 77 year old female accompanied by her husband presenting today with reports of chronic sinus drainage, worse at night.  Does not sleep in the bed so is not sure if she is snoring loudly or having apneic episodes.  Has not tried anything for her symptoms so far.  Often wakes in the middle of the night feeling that her throat is very dry and the secretions have thickened. History of environmental allergies.  Continue daily use of Flonase.  Add Zyrtec/Claritin or other daily antihistamine to see if this is helpful.  2. Hypothyroidism due to Hashimoto's thyroiditis Recently switched from levothyroxine to Armour Thyroid however she has not tolerated this well.  Feels that it has made her more emotional and has worsened her trembling.  Would like to switch back to levothyroxine as previously prescribed.  Okay to switch back as she has a supply on hand.  Plan to recheck her labs in 6-8 weeks.  3. Ear symptom Has had a few weeks of ear symptoms that were very bothersome.  Has had some itching in her ears and a sensation like bugs are crawling in her ear canals.  She was able to self irrigate her right ear and got significant amount of cerumen out.  Her right ear symptoms have improved.  Her left ear symptoms have not.  She was unable to get anything out of the left ear and presents today for further evaluation.  On evaluation, the left ear canal is blocked with a shiny black object that is deep in the ear near the TM.  Irrigation performed of the left ear.  Patient tolerated well. Impaction cleared successfully. Ears look good with no signs of infection. Suspect allergy mediated symptoms. Recommend starting Zyrtec or Claritin nightly to see I this is helpful.    Procedures performed this visit: None.  Return if symptoms worsen or fail to improve.  __________________________________ Thayer Ohm, DNP,  APRN, FNP-BC Primary Care and Sports Medicine [facility] [place]

## 2023-07-03 ENCOUNTER — Other Ambulatory Visit: Payer: Self-pay

## 2023-07-03 DIAGNOSIS — E063 Autoimmune thyroiditis: Secondary | ICD-10-CM

## 2023-07-03 DIAGNOSIS — E038 Other specified hypothyroidism: Secondary | ICD-10-CM | POA: Diagnosis not present

## 2023-07-03 NOTE — Progress Notes (Signed)
Switched labs to [employer].

## 2023-08-10 ENCOUNTER — Telehealth: Payer: Self-pay | Admitting: Student-PharmD

## 2023-08-10 ENCOUNTER — Other Ambulatory Visit: Payer: Self-pay | Admitting: Medical-Surgical

## 2023-08-10 NOTE — Progress Notes (Signed)
This patient is appearing on a report for being at risk of failing the adherence measure for hypertension (ACEi/ARB) medications this calendar year.   Medication: Lisinopril 5mg  tablets Last fill date: 07/23/2023 for 90 day supply according to [facility] portal  Phone call not needed. Patient adherent according to [facility] refill on 07/23/23    Sharee Pimple

## 2023-11-12 ENCOUNTER — Other Ambulatory Visit: Payer: Self-pay | Admitting: Medical-Surgical

## 2023-11-12 MED ORDER — LEVOTHYROXINE SODIUM 25 MCG PO TABS: 37.5000 ug | ORAL_TABLET | Freq: Every day | ORAL | 1 refills | Status: DC

## 2023-11-12 NOTE — Telephone Encounter (Signed)
Copied from CRM [id number]. Topic: Clinical - Medication Refill >> Nov 12, 2023 10:00 AM Fuller Mandril wrote: Most Recent Primary Care Visit:  Provider: Christen Butter  Department: [facility]  Visit Type: OFFICE VISIT  Date: 05/24/2023  Medication:  levothyroxine (SYNTHROID) 25 MCG tablet  Has the patient contacted their pharmacy? Yes (Agent: If no, request that the patient contact the pharmacy for the refill. If patient does not wish to contact the pharmacy document the reason why and proceed with request.) (Agent: If yes, when and what did the pharmacy advise?)   Is this the correct pharmacy for this prescription? Yes If no, delete pharmacy and type the correct one.  This is the patient's preferred pharmacy:  [id number] - [place], [place] - [address] [facility] [address] [place] [address] Phone: [phone] Fax: [phone]   Has the prescription been filled recently? No  Is the patient out of the medication? No  Has the patient been seen for an appointment in the last year OR does the patient have an upcoming appointment? Yes  Can we respond through MyChart? No  Agent: Please be advised that Rx refills may take up to 3 business days. We ask that you follow-up with your pharmacy.

## 2024-02-18 DIAGNOSIS — S52501A Unspecified fracture of the lower end of right radius, initial encounter for closed fracture: Secondary | ICD-10-CM | POA: Diagnosis not present

## 2024-02-18 DIAGNOSIS — R918 Other nonspecific abnormal finding of lung field: Secondary | ICD-10-CM | POA: Diagnosis not present

## 2024-02-18 DIAGNOSIS — S299XXA Unspecified injury of thorax, initial encounter: Secondary | ICD-10-CM | POA: Diagnosis not present

## 2024-02-18 DIAGNOSIS — I160 Hypertensive urgency: Secondary | ICD-10-CM | POA: Diagnosis not present

## 2024-02-18 DIAGNOSIS — S0990XA Unspecified injury of head, initial encounter: Secondary | ICD-10-CM | POA: Diagnosis not present

## 2024-02-18 DIAGNOSIS — W19XXXA Unspecified fall, initial encounter: Secondary | ICD-10-CM | POA: Diagnosis not present

## 2024-02-18 DIAGNOSIS — S20212A Contusion of left front wall of thorax, initial encounter: Secondary | ICD-10-CM | POA: Diagnosis not present

## 2024-02-18 DIAGNOSIS — R296 Repeated falls: Secondary | ICD-10-CM | POA: Diagnosis not present

## 2024-02-18 DIAGNOSIS — Z87891 Personal history of nicotine dependence: Secondary | ICD-10-CM | POA: Diagnosis not present

## 2024-02-18 DIAGNOSIS — S0181XA Laceration without foreign body of other part of head, initial encounter: Secondary | ICD-10-CM | POA: Diagnosis not present

## 2024-02-18 DIAGNOSIS — I10 Essential (primary) hypertension: Secondary | ICD-10-CM | POA: Diagnosis not present

## 2024-02-18 DIAGNOSIS — S01112A Laceration without foreign body of left eyelid and periocular area, initial encounter: Secondary | ICD-10-CM | POA: Diagnosis not present

## 2024-02-18 DIAGNOSIS — S52502A Unspecified fracture of the lower end of left radius, initial encounter for closed fracture: Secondary | ICD-10-CM | POA: Diagnosis not present

## 2024-02-18 DIAGNOSIS — Z79899 Other long term (current) drug therapy: Secondary | ICD-10-CM | POA: Diagnosis not present

## 2024-02-29 ENCOUNTER — Encounter: Payer: Self-pay | Admitting: Sports Medicine

## 2024-02-29 ENCOUNTER — Ambulatory Visit

## 2024-02-29 ENCOUNTER — Ambulatory Visit (INDEPENDENT_AMBULATORY_CARE_PROVIDER_SITE_OTHER): Admitting: Sports Medicine

## 2024-02-29 DIAGNOSIS — S52501A Unspecified fracture of the lower end of right radius, initial encounter for closed fracture: Secondary | ICD-10-CM

## 2024-02-29 DIAGNOSIS — Z0389 Encounter for observation for other suspected diseases and conditions ruled out: Secondary | ICD-10-CM | POA: Diagnosis not present

## 2024-02-29 DIAGNOSIS — M7989 Other specified soft tissue disorders: Secondary | ICD-10-CM | POA: Diagnosis not present

## 2024-02-29 NOTE — Progress Notes (Signed)
    Procedures performed today:    None.  Independent interpretation of notes and tests performed by another provider:   None.  Brief History, Exam, Impression, and Recommendations:    Traumatic closed nondisplaced fracture of distal end of right radius, initial encounter 2-week status post fall with nondisplaced distal radius fracture, appropriately placed in a sugar-tong splint, doing well today, she still has a bit of tenderness at the distal dorsal radius, we can transition her into a Velcro brace, I would like updated x-rays, home PT given, return in another 4 to 6 weeks.    ____________________________________________ Joselyn Nicely. Sandy Crumb, M.D., ABFM., CAQSM., AME. Primary Care and Sports Medicine [facility]  Adjunct Professor of [facility] Medicine  [employer]  Restaurant manager, fast food

## 2024-02-29 NOTE — Assessment & Plan Note (Signed)
 2-week status post fall with nondisplaced distal radius fracture, appropriately placed in a sugar-tong splint, doing well today, she still has a bit of tenderness at the distal dorsal radius, we can transition her into a Velcro brace, I would like updated x-rays, home PT given, return in another 4 to 6 weeks.

## 2024-03-06 ENCOUNTER — Encounter: Payer: Self-pay | Admitting: Medical-Surgical

## 2024-03-06 ENCOUNTER — Ambulatory Visit: Payer: Self-pay | Admitting: Sports Medicine

## 2024-03-06 ENCOUNTER — Ambulatory Visit (INDEPENDENT_AMBULATORY_CARE_PROVIDER_SITE_OTHER): Admitting: Medical-Surgical

## 2024-03-06 VITALS — BP 180/94 | HR 80 | Resp 20 | Ht 67.0 in | Wt 194.0 lb

## 2024-03-06 DIAGNOSIS — E063 Autoimmune thyroiditis: Secondary | ICD-10-CM

## 2024-03-06 DIAGNOSIS — R413 Other amnesia: Secondary | ICD-10-CM

## 2024-03-06 DIAGNOSIS — I10 Essential (primary) hypertension: Secondary | ICD-10-CM

## 2024-03-06 NOTE — Progress Notes (Signed)
        Established patient visit  History, exam, impression, and plan:  1. Primary hypertension (Primary) Pleasant 78 year old female presenting today due to uncontrolled blood pressure. She and her husband have been monitoring her BP at home and treating with Lisinopril  5mg  tablets, titrated depending on her daily readings. If her BP remains high, he will have her take 5mg  every few hours of the day until it comes down. Notes her BP was well controlled prior to her recent fall and injury. Since then, it has stayed high. He has given her a max of 20mg  in one day but not every day. She also has a history of whitecoat HTN. Admits that she took Lisinopril  15mg  this morning before coming in because she knew her BP would be high. On arrival her BP is significantly elevated and again on recheck. Ultimately, would like to determine a daily dose that is appropriate for her and keeps her BP stable but they remain concerned regarding low blood pressure and medication intolerance. She is notoriously sensitive to medications and tends to not take them if they cause her any side effects. Hesitant to make changes today as they both feel this elevation is a reaction to her recent injury from a fall and the associated treatment. Recommend continuing the current medication and dosing. They have a new BP cuff coming in. I would like her to return in 2 weeks for a NV for BP check and make sure to bring home readings and the new BP cuff in so it can be checked for accuracy.   2. Memory changes Endorses significant concern regarding memory changes. Feels that she is a lot more forgetful and it is interfering with her daily activities. Notes her family is concerned as well. Interested in further evaluation for possible Alzheimer's or other cause of her memory changes. Discussed that some memory changes are normal with aging and her prior imaging did not show any abnormalities outside of mild ischemic white matter changes, also  associated with aging. Plan to evaluate with labs below to rule out a metabolic cause to memory changes. Offered a referral for neuropsychiatric testing but this was declined today.  - TSH - Vitamin B12 - Sedimentation rate - CBC - Folate  3. Hypothyroidism due to Hashimoto's thyroiditis Taking levothyroxine  37.5mg  daily, tolerating well w/o SE. Had the pharmacy change the manufacturer and is worried that the medication is not working right with the new pill. Rechecking TSH today.  - TSH  Procedures performed this visit: None.  Return in about 2 weeks (around 03/20/2024) for nurse visit for BP check.  __________________________________ Nina Snook, DNP, APRN, FNP-BC Primary Care and Sports Medicine [facility]

## 2024-03-07 LAB — TSH: TSH: 2.84 u[IU]/mL (ref 0.450–4.500)

## 2024-03-07 LAB — CBC
Hematocrit: 38.4 % (ref 34.0–46.6)
Hemoglobin: 12.5 g/dL (ref 11.1–15.9)
MCH: 30.9 pg (ref 26.6–33.0)
MCHC: 32.6 g/dL (ref 31.5–35.7)
MCV: 95 fL (ref 79–97)
Platelets: 199 10*3/uL (ref 150–450)
RBC: 4.05 x10E6/uL (ref 3.77–5.28)
RDW: 12.8 % (ref 11.7–15.4)
WBC: 5.1 10*3/uL (ref 3.4–10.8)

## 2024-03-07 LAB — SEDIMENTATION RATE: Sed Rate: 29 mm/h (ref 0–40)

## 2024-03-07 LAB — FOLATE: Folate: 11.7 ng/mL (ref >3.0)

## 2024-03-07 LAB — VITAMIN B12: Vitamin B-12: 444 pg/mL (ref 232–1245)

## 2024-03-08 ENCOUNTER — Ambulatory Visit: Payer: Self-pay | Admitting: Medical-Surgical

## 2024-03-13 ENCOUNTER — Ambulatory Visit

## 2024-03-13 VITALS — Ht 66.0 in | Wt 194.0 lb

## 2024-03-13 DIAGNOSIS — Z0000 Encounter for general adult medical examination without abnormal findings: Secondary | ICD-10-CM | POA: Diagnosis not present

## 2024-03-13 NOTE — Progress Notes (Signed)
 Subjective:   Arwilda Georgia is a 78 y.o. female who presents for Medicare Annual (Subsequent) preventive examination.  Visit Complete: Virtual I connected with  Nestor Banter on 03/13/24 by a audio enabled telemedicine application and verified that I am speaking with the correct person using two identifiers.  Patient Location: Home  Provider Location: Office/Clinic  I discussed the limitations of evaluation and management by telemedicine. The patient expressed understanding and agreed to proceed.  Vital Signs: Because this visit was a virtual/telehealth visit, some criteria may be missing or patient reported. Any vitals not documented were not able to be obtained and vitals that have been documented are patient reported.  Patient Medicare AWV questionnaire was completed by the patient on n/a; I have confirmed that all information answered by patient is correct and no changes since this date.  Cardiac Risk Factors include: advanced age (>69men, >65 women);obesity (BMI >30kg/m2);hypertension     Objective:    Today's Vitals   03/13/24 1411  Weight: 194 lb (88 kg)  Height: 5\' 6"  (1.676 m)   Body mass index is 31.31 kg/m.     03/13/2024    2:19 PM 02/13/2023    3:04 PM 01/30/2023    3:18 PM 01/11/2021    3:09 PM  Advanced Directives  Does Patient Have a Medical Advance Directive? Yes Yes Yes Yes  Type of Estate agent of [place];Living will Living will  Healthcare Power of [place];Living will  Does patient want to make changes to medical advance directive?  No - Patient declined  No - Patient declined  Copy of Healthcare Power of Attorney in Chart? No - copy requested   No - copy requested    Current Medications (verified) Outpatient Encounter Medications as of 03/13/2024  Medication Sig   Acetaminophen  (TYLENOL  8 HOUR PO) Take by mouth.   levothyroxine  (SYNTHROID ) 25 MCG tablet Take 1.5 tablets (37.5 mcg total) by mouth daily.   lisinopril   (ZESTRIL ) 5 MG tablet Take 1 tablet (5 mg total) by mouth in the morning and at bedtime.   No facility-administered encounter medications on file as of 03/13/2024.    Allergies (verified) Azithromycin, Iodinated contrast media, Codeine, Lisinopril -hydrochlorothiazide , and Hydrocodone   History: Past Medical History:  Diagnosis Date   Carpal tunnel syndrome    H/O renal calculi    right   Hashimoto's disease    Hypertension    Renal calculi    3 days ago   Past Surgical History:  Procedure Laterality Date   CHOLECYSTECTOMY     Normal Stess test  09/05/2012   Normal Nuclear Stress test. See scanned document   TONSILLECTOMY     TUBAL LIGATION     Family History  Problem Relation Age of Onset   Hashimoto's thyroiditis Mother    Hashimoto's thyroiditis Sister    Hashimoto's thyroiditis Sister    Hashimoto's thyroiditis Sister    Hashimoto's thyroiditis Brother    Social History   Socioeconomic History   Marital status: Married    Spouse name: Gaylin Ke   Number of children: 4   Years of education: 12   Highest education level: 12th grade  Occupational History   Occupation: Retired  Tobacco Use   Smoking status: Never    Passive exposure: Past   Smokeless tobacco: Never  Vaping Use   Vaping status: Never Used  Substance and Sexual Activity   Alcohol use: Yes    Comment: rarely   Drug use: Yes    Comment: Delta 8  Sexual activity: Not on file  Other Topics Concern   Not on file  Social History Narrative   Lives with her spouse and her grandson. She has four children. She enjoys reading and watching tv.   Social Drivers of Corporate investment banker Strain: Low Risk  (03/13/2024)   Overall Financial Resource Strain (CARDIA)    Difficulty of Paying Living Expenses: Not hard at all  Food Insecurity: No Food Insecurity (03/13/2024)   Hunger Vital Sign    Worried About Running Out of Food in the Last Year: Never true    Ran Out of Food in the Last Year: Never  true  Transportation Needs: No Transportation Needs (03/13/2024)   PRAPARE - Administrator, Civil Service (Medical): No    Lack of Transportation (Non-Medical): No  Physical Activity: Sufficiently Active (03/13/2024)   Exercise Vital Sign    Days of Exercise per Week: 5 days    Minutes of Exercise per Session: 60 min  Stress: No Stress Concern Present (03/13/2024)   [employer] of Occupational Health - Occupational Stress Questionnaire    Feeling of Stress : Not at all  Social Connections: Moderately Isolated (03/13/2024)   Social Connection and Isolation Panel [NHANES]    Frequency of Communication with Friends and Family: More than three times a week    Frequency of Social Gatherings with Friends and Family: More than three times a week    Attends Religious Services: Never    Database administrator or Organizations: No    Attends Engineer, structural: Never    Marital Status: Married    Tobacco Counseling Counseling given: Not Answered   Clinical Intake:  Pre-visit preparation completed: Yes  Pain : No/denies pain     BMI - recorded: 31.31 Nutritional Status: BMI > 30  Obese Nutritional Risks: Other (Comment) Diabetes: No  How often do you need to have someone help you when you read instructions, pamphlets, or other written materials from your doctor or pharmacy?: 1 - Never What is the last grade level you completed in school?: 12  Interpreter Needed?: No      Activities of Daily Living    03/13/2024    2:13 PM  In your present state of health, do you have any difficulty performing the following activities:  Hearing? 0  Vision? 0  Difficulty concentrating or making decisions? 1  Walking or climbing stairs? 0  Dressing or bathing? 0  Doing errands, shopping? 0  Preparing Food and eating ? N  Using the Toilet? N  In the past six months, have you accidently leaked urine? Y  Do you have problems with loss of bowel control? N  Managing  your Medications? N  Managing your Finances? N  Housekeeping or managing your Housekeeping? N    Patient Care Team: Cherre Cornish, NP as PCP - General (Nurse Practitioner)  Indicate any recent Medical Services you may have received from other than [facility] providers in the past year (date may be approximate).     Assessment:   This is a routine wellness examination for [place].  Hearing/Vision screen No results found.   Goals Addressed   None   Depression Screen    03/13/2024    2:18 PM 03/06/2024    1:49 PM 05/24/2023    3:45 PM 05/01/2023    4:27 PM 02/13/2023    3:04 PM 01/30/2023    3:17 PM 12/26/2022    3:50 PM  PHQ 2/9 Scores  PHQ - 2 Score 0 2 2 2  0 0 0  PHQ- 9 Score  5  5       Fall Risk    03/13/2024    2:20 PM 03/06/2024    1:49 PM 05/24/2023    3:44 PM 05/01/2023    4:27 PM 02/13/2023    3:04 PM  Fall Risk   Falls in the past year? 1 1 0 0 0  Number falls in past yr: 0 0 0 0 0  Injury with Fall? 1 1 0 0 0  Risk for fall due to : Impaired balance/gait History of fall(s) No Fall Risks No Fall Risks No Fall Risks  Follow up Falls evaluation completed Falls evaluation completed Falls evaluation completed Falls evaluation completed Falls evaluation completed    MEDICARE RISK AT HOME: Medicare Risk at Home Any stairs in or around the home?: Yes If so, are there any without handrails?: Yes Home free of loose throw rugs in walkways, pet beds, electrical cords, etc?: Yes Adequate lighting in your home to reduce risk of falls?: Yes Life alert?: No Use of a cane, walker or w/c?: No Grab bars in the bathroom?: Yes Shower chair or bench in shower?: Yes Elevated toilet seat or a handicapped toilet?: Yes  TIMED UP AND GO:  Was the test performed?  No    Cognitive Function:        03/13/2024    2:23 PM 02/13/2023    3:11 PM  6CIT Screen  What Year? 0 points 0 points  What month? 0 points 0 points  What time? 0 points 0 points  Count back from 20 2 points 0 points   Months in reverse 4 points 4 points  Repeat phrase 8 points 0 points  Total Score 14 points 4 points    Immunizations Immunization History  Administered Date(s) Administered   Janssen (J&J) SARS-COV-2 Vaccination 06/25/2020, 10/14/2020   Pneumococcal Polysaccharide-23 03/16/2021   Tdap 01/03/2016    TDAP status: Up to date  Flu Vaccine status: Declined, Education has been provided regarding the importance of this vaccine but patient still declined. Advised may receive this vaccine at local pharmacy or Health Dept. Aware to provide a copy of the vaccination record if obtained from local pharmacy or Health Dept. Verbalized acceptance and understanding.  Pneumococcal vaccine status: Up to date  Covid-19 vaccine status: Completed vaccines  Qualifies for Shingles Vaccine? Yes   Zostavax completed No   Shingrix Completed?: No.    Education has been provided regarding the importance of this vaccine. Patient has been advised to call insurance company to determine out of pocket expense if they have not yet received this vaccine. Advised may also receive vaccine at local pharmacy or Health Dept. Verbalized acceptance and understanding.  Screening Tests Health Maintenance  Topic Date Due   Zoster Vaccines- Shingrix (1 of 2) Never done   DEXA SCAN  Never done   Pneumonia Vaccine 28+ Years old (2 of 2 - PCV) 03/16/2022   COVID-19 Vaccine (3 - 2024-25 season) 03/22/2024 (Originally 06/17/2023)   INFLUENZA VACCINE  12/05/2028 (Originally 05/16/2024)   Medicare Annual Wellness (AWV)  03/13/2025   DTaP/Tdap/Td (2 - Td or Tdap) 01/02/2026   HPV VACCINES  Aged Out   Meningococcal B Vaccine  Aged Out    Health Maintenance  Health Maintenance Due  Topic Date Due   Zoster Vaccines- Shingrix (1 of 2) Never done   DEXA SCAN  Never done   Pneumonia Vaccine 65+  Years old (2 of 2 - PCV) 03/16/2022    Colorectal cancer screening: No longer required.   Mammogram status: No longer required due to  patient declined.  Bone Density- patient declined.    Lung Cancer Screening: (Low Dose CT Chest recommended if Age 40-80 years, 20 pack-year currently smoking OR have quit w/in 15years.) does not qualify.   Lung Cancer Screening Referral: n/a  Additional Screening:  Hepatitis C Screening: does not qualify; Completed   Vision Screening: Recommended annual ophthalmology exams for early detection of glaucoma and other disorders of the eye. Is the patient up to date with their annual eye exam?  No  Who is the provider or what is the name of the office in which the patient attends annual eye exams?  If pt is not established with a provider, would they like to be referred to a provider to establish care? No .   Dental Screening: Recommended annual dental exams for proper oral hygiene   Community Resource Referral / Chronic Care Management: CRR required this visit?  No   CCM required this visit?  No     Plan:     I have personally reviewed and noted the following in the patient's chart:   Medical and social history Use of alcohol, tobacco or illicit drugs  Current medications and supplements including opioid prescriptions. Patient is not currently taking opioid prescriptions. Functional ability and status Nutritional status Physical activity Advanced directives List of other physicians Hospitalizations, surgeries, and ER # 1 visits in previous 12 months Vitals Screenings to include cognitive, depression, and falls Referrals and appointments  In addition, I have reviewed and discussed with patient certain preventive protocols, quality metrics, and best practice recommendations. A written personalized care plan for preventive services as well as general preventive health recommendations were provided to patient.     Aubrey Leaf, CMA   03/13/2024   After Visit Summary: (Mail) Due to this being a telephonic visit, the after visit summary with patients personalized plan  was offered to patient via mail   Nurse Notes:      Laparis Durrett is a 78 y.o. female patient of Cherre Cornish, NP who had a Medicare Annual Wellness Visit today via telephone. Cecille is Retired and lives with their spouse and grandson. She has 4 children. She reports that she is socially active and does interact with friends/family regularly. she is moderately physically active and enjoys reading and watching tv.

## 2024-03-13 NOTE — Patient Instructions (Signed)
  Nina Kirby , Thank you for taking time to come for your Medicare Wellness Visit. I appreciate your ongoing commitment to your health goals. Please review the following plan we discussed and let me know if I can assist you in the future.   These are the goals we discussed:  Goals       Patient Stated (pt-stated)      Patient stated that she would like to get healthier.        This is a list of the screening recommended for you and due dates:  Health Maintenance  Topic Date Due   Zoster (Shingles) Vaccine (1 of 2) Never done   DEXA scan (bone density measurement)  Never done   Pneumonia Vaccine (2 of 2 - PCV) 03/16/2022   COVID-19 Vaccine (3 - 2024-25 season) 03/22/2024*   Flu Shot  12/05/2028*   Medicare Annual Wellness Visit  03/13/2025   DTaP/Tdap/Td vaccine (2 - Td or Tdap) 01/02/2026   HPV Vaccine  Aged Out   Meningitis B Vaccine  Aged Out  *Topic was postponed. The date shown is not the original due date.

## 2024-03-20 ENCOUNTER — Ambulatory Visit (INDEPENDENT_AMBULATORY_CARE_PROVIDER_SITE_OTHER)

## 2024-03-20 VITALS — BP 213/113 | HR 77 | Resp 20 | Ht 67.0 in

## 2024-03-20 DIAGNOSIS — I10 Essential (primary) hypertension: Secondary | ICD-10-CM

## 2024-03-20 NOTE — Progress Notes (Signed)
 Pt is here for a BP check. Last OV 180/94. Pt denies CP, SOB, or headache.                        Pt first BP reading was 180/89 and the second was 213/113. Reported this to Cherre Cornish, NP she recommended pt increase lisinopril  from 5mg  to 20mg  1xday. And RTC in 2wks for an nurse visit. Pt and her husband declined both.

## 2024-03-28 ENCOUNTER — Ambulatory Visit: Admitting: Sports Medicine

## 2024-04-01 ENCOUNTER — Other Ambulatory Visit: Payer: Self-pay | Admitting: Medical-Surgical

## 2024-04-01 ENCOUNTER — Ambulatory Visit: Payer: Self-pay

## 2024-04-01 NOTE — Telephone Encounter (Addendum)
 Routing to clinic, attempted to contact x3.   Copied from CRM [id number]. Topic: Clinical - Prescription Issue >> Apr 01, 2024  3:38 PM Nina Kirby wrote: Reason for CRM: Patient would like to be switched to the old manufacture for levothyroxine  (SYNTHROID ) 25 MCG tablet because she said that she is having stomach issues (stomach is sore where hernia is located) with the manufacturer that she was switched to for the levothyroxine  (SYNTHROID ) 25 MCG tablet. Please give patient a call back. Call back # [phone].

## 2024-04-08 ENCOUNTER — Other Ambulatory Visit: Payer: Self-pay

## 2024-04-08 MED ORDER — LEVOTHYROXINE SODIUM 25 MCG PO TABS: 25.0000 ug | ORAL_TABLET | Freq: Every day | ORAL | 1 refills | Status: DC

## 2024-04-08 NOTE — Telephone Encounter (Signed)
 Medication sent to a different pharmacy. She will call us  if there is a pharmacy.

## 2024-04-08 NOTE — Addendum Note (Signed)
 Addended by: Tahra Hitzeman H on: 04/08/2024 03:14 PM   Modules accepted: Orders

## 2024-04-10 DIAGNOSIS — R600 Localized edema: Secondary | ICD-10-CM | POA: Diagnosis not present

## 2024-04-10 DIAGNOSIS — I10 Essential (primary) hypertension: Secondary | ICD-10-CM | POA: Diagnosis not present

## 2024-04-10 DIAGNOSIS — Z79899 Other long term (current) drug therapy: Secondary | ICD-10-CM | POA: Diagnosis not present

## 2024-04-10 DIAGNOSIS — E079 Disorder of thyroid, unspecified: Secondary | ICD-10-CM | POA: Diagnosis not present

## 2024-04-10 DIAGNOSIS — M7989 Other specified soft tissue disorders: Secondary | ICD-10-CM | POA: Diagnosis not present

## 2024-04-10 NOTE — ED Provider Notes (Signed)
 [facility]  ED Provider Note  Nina Kirby 78 y.o. female DOB: 1946/07/22 MRN: [id number] History   Chief Complaint  Patient presents with  . Hypertension    Hypertensive with BLE swelling x1 hour. States she can feel when her pressure goes up because she gets shaky. Denies headache, chest pain, vision changes. Hx HTN, states she took 20 mg lisinopril  PTA   Patient presents due to elevated blood pressure states that she has a history of hypertension takes lisinopril  between 5 mg and 10 mg a day this evening her blood pressure went skyhigh she took 20 mg of lisinopril  this evening without immediate effect and presents here for further evaluation  Patient has a history of hypertension thyroid  disease States has not felt well since January when her pharmacologist switched her brand of thyroid   Allergies, see list         Past Medical History:  Diagnosis Date  . Allergy   . Arthritis   . Hypertension     Past Surgical History:  Procedure Laterality Date  . Cholecystectomy    . Tonsillectomy    . Tubal ligation      Social History   Substance and Sexual Activity  Alcohol Use Not Currently   Tobacco Use History[1] E-Cigarettes  . Vaping Use Never User   . Start Date    . Cartridges/Day    . Quit Date     Social History   Substance and Sexual Activity  Drug Use Never         Allergies[2]  Home Medications   ACETAMINOPHEN  (TYLENOL ) 325 MG TABLET    Take two tablets (650 mg dose) by mouth every 6 (six) hours as needed.   CYANOCOBALAMIN ([employer] VITAMIN B12) 1000 MCG TABLET    Take one tablet (1,000 mcg dose) by mouth daily.   IBUPROFEN (ADVIL,MOTRIN) 200 MG TABLET    Take one tablet (200 mg dose) by mouth every 6 (six) hours as needed for Pain.   LEVOTHYROXINE  SODIUM (SYNTHROID ,LOVOTHROID,LEVOXYL ) 25 MCG TABLET    TAKE 1 TABLET(25 MCG) BY MOUTH DAILY 30 MINUTES BEFORE BREAKFAST WITH WATER AND ON AN EMPTY STOMACH    LISINOPRIL  (PRINIVIL ,ZESTRIL ) 5 MG TABLET    Take one tablet (5 mg dose) by mouth 2 (two) times daily.   LOSARTAN POTASSIUM (COZAAR) 100 MG TABLET    Take one tablet (100 mg dose) by mouth daily.   METOPROLOL  TARTRATE (LOPRESSOR ) 50 MG TABLET    Take one tablet (50 mg dose) by mouth 2 (two) times daily.    Primary Survey  Primary Survey  Review of Systems   Review of Systems  Constitutional:  Negative for fever.  Respiratory:  Negative for shortness of breath.   Cardiovascular:  Positive for leg swelling. Negative for chest pain.  Gastrointestinal:  Negative for abdominal pain, nausea and vomiting.  All other systems reviewed and are negative.   Physical Exam   ED Triage Vitals [04/10/24 0212]  BP (!) 224/127  Heart Rate 100  Resp 18  SpO2 98 %  Temp 98 F (36.7 C)    Physical Exam  Constitutional: She appears well-developed and well-nourished.  HENT:  Head: Normocephalic.  Eyes: Pupils are equal, round, and reactive to light.  Neck: Normal range of motion. Neck supple.  Cardiovascular: Normal rate and regular rhythm.  Pulmonary/Chest: Respiratory effort normal and breath sounds normal.  Abdominal: Soft.  Musculoskeletal:     Cervical back: Normal range of motion and neck supple.  1+ bilateral pitting edema.  Neurological: She is alert.  Skin: Skin is warm.  Psychiatric: She has a normal mood and affect.     ED Course   Lab results:   CBC AND DIFFERENTIAL - Abnormal      Result Value   WBC 4.1     RBC 3.89 (*)    HGB 11.9     HCT 35.4     MCV 91.0     MCH 30.6     MCHC 33.6     Plt Ct 212     RDW SD 44.3     MPV 9.8     NRBC% 0.0     Absolute NRBC Count 0.00     NEUTROPHIL % 51.9     LYMPHOCYTE % 33.2     MONOCYTE % 12.2     Eosinophil % 2.0     BASOPHIL % 0.5     IG% 0.2     ABSOLUTE NEUTROPHIL COUNT 2.13     ABSOLUTE LYMPHOCYTE COUNT 1.36     Absolute Monocyte Count 0.50     Absolute Eosinophil Count 0.08     Absolute Basophil Count 0.02      Absolute Immature Granulocyte Count 0.01    COMPREHENSIVE METABOLIC PANEL - Abnormal   Na 139     Potassium 3.7     Cl 105     CO2 20     AGAP 14     Glucose 134 (*)    BUN 19     Creatinine 0.94     Ca 10.0     ALK PHOS 112     T Bili 0.3     Total Protein 7.9     Alb 4.5     GLOBULIN 3.4     ALBUMIN/GLOBULIN RATIO 1.3     BUN/CREAT RATIO 20.2     ALT 11     AST 17     eGFR 61     Comment: Normal GFR (glomerular filtration rate) > 60 mL/min/1.73 meters squared, < 60 may include impaired kidney function. Calculation based on the Chronic Kidney Disease Epidemiology Collaboration (CK-EPI)equation refit without adjustment for race.  MAGNESIUM - Normal   Mg 1.9      Imaging: No data to display    ECG: ECG Results   None                                                                     Pre-Sedation Procedures    Medical Decision Making Presents because of spike in her blood pressure this evening which concerned her over 200 systolic/ Patient has a history of hypertension and takes lisinopril  between 5 and 10 mg a day depending on her blood pressure  Tonight because of elevated blood pressure took 20 mg of lisinopril  and without an immediate effect presented here  Patient was given hydralazine 10 mg x 2 Laboratory studies drawn and unremarkable  Blood pressure has improved patient feels clinically okay medically stable be discharged  Amount and/or Complexity of Data Reviewed Labs: ordered.  Risk Prescription drug management.           Provider Communication  New Prescriptions   No medications on file    Modified Medications  No medications on file    Discontinued Medications   No medications on file    Clinical Impression Final diagnoses:  Elevated blood pressure reading  History of hypertension    ED Disposition     ED Disposition  Discharge   Condition  Stable   Comment  --                    Electronically signed by:    Lynwood JAYSON Life, MD 04/10/24 [id number]      [1] Social History Tobacco Use  Smoking Status Never  Smokeless Tobacco Never  [2] Allergies Allergen Reactions  . Azithromycin Anaphylaxis and Swelling    Oral swelling   . Iodinated Contrast Media [Iodinated Diagnostic Agents] Anaphylaxis  . Codeine Other    Jitters   . Lisinopril -Hydrochlorothiazide  Other and Hallucinations    Mental status changes, jaw pain  . Hydrocodone Confusion/Altered Mental Status

## 2024-04-10 NOTE — ED Notes (Signed)
 Pt's bp came down prior to hydralazine administration, so discussed with Dr Link - verbal order to give 10mg  Hydralazine.

## 2024-04-10 NOTE — ED Notes (Signed)
 Bp 188/88 after initial dose of hydralazine, verbal order from Dr Link to give another 10mg  of hydralazine.

## 2024-04-15 ENCOUNTER — Inpatient Hospital Stay: Admitting: Medical-Surgical

## 2024-04-16 ENCOUNTER — Other Ambulatory Visit: Payer: Self-pay | Admitting: Medical-Surgical

## 2024-04-16 DIAGNOSIS — I10 Essential (primary) hypertension: Secondary | ICD-10-CM

## 2024-04-17 ENCOUNTER — Ambulatory Visit: Admitting: Medical-Surgical

## 2024-04-17 ENCOUNTER — Inpatient Hospital Stay: Admitting: Medical-Surgical

## 2024-04-17 ENCOUNTER — Ambulatory Visit: Payer: Self-pay

## 2024-04-17 ENCOUNTER — Encounter: Payer: Self-pay | Admitting: Medical-Surgical

## 2024-04-17 DIAGNOSIS — I10 Essential (primary) hypertension: Secondary | ICD-10-CM

## 2024-04-17 MED ORDER — HYDRALAZINE HCL 25 MG PO TABS: 25.0000 mg | ORAL_TABLET | Freq: Three times a day (TID) | ORAL | 0 refills | Status: DC

## 2024-04-17 MED ORDER — LISINOPRIL 10 MG PO TABS: 10.0000 mg | ORAL_TABLET | Freq: Three times a day (TID) | ORAL | 3 refills | Status: AC

## 2024-04-17 MED ORDER — HYDROXYZINE HCL 10 MG PO TABS
10.0000 mg | ORAL_TABLET | Freq: Three times a day (TID) | ORAL | 0 refills | Status: AC | PRN
Start: 2024-04-17 — End: ?

## 2024-04-17 NOTE — Progress Notes (Deleted)
   Established patient visit  History, exam, impression, and plan:  No problem-specific Assessment & Plan notes found for this encounter.   ROS  Physical Exam  Procedures performed this visit: None.  No follow-ups on file.  __________________________________ Thayer Ohm, DNP, APRN, FNP-BC Primary Care and Sports Medicine [facility]

## 2024-04-17 NOTE — Telephone Encounter (Signed)
 FYI Only or Action Required?: FYI only for provider.  Patient was last seen in primary care on 03/06/2024 by Willo Mini, NP. Called Nurse Triage reporting Hypertension. Symptoms began today. Interventions attempted: Prescription medications: took extra dose of lisinopril . Symptoms are: unchanged.  Triage Disposition: See Physician Within 24 Hours  Patient/caregiver understands and will follow disposition?: Yes  Copied from CRM [id number]. Topic: Clinical - Red Word Triage >> Apr 17, 2024  9:58 AM Nina Kirby wrote: Red Word that prompted transfer to Nurse Triage: Patient's husband, Nina Kirby, called and stated that the patient's BP 190/109. He states that is the reading for this morning. They doubled down on her hypertension medication but it is not working to lower the BP. Reason for Disposition  Systolic BP  >= 180 OR Diastolic >= 110  Answer Assessment - Initial Assessment Questions 1. BLOOD PRESSURE: What is the blood pressure? Did you take at least two measurements 5 minutes apart?     190/109 2. ONSET: When did you take your blood pressure?     Today. Recently in hospital for htn 3. HOW: How did you take your blood pressure? (e.g., automatic home BP monitor, visiting nurse)     home 4. HISTORY: Do you have a history of high blood pressure?     yes 5. MEDICINES: Are you taking any medicines for blood pressure? Have you missed any doses recently?     As prescribed  6. OTHER SYMPTOMS: Do you have any symptoms? (e.g., blurred vision, chest pain, difficulty breathing, headache, weakness)     Mild headache. Denies all other symptoms  Additional info: requesting med change.  Protocols used: Blood Pressure - High-A-AH

## 2024-04-17 NOTE — Progress Notes (Signed)
        Established patient visit  History, exam, impression, and plan:  1. Primary hypertension Pleasant 78 year old female accompanied by her husband presenting today with a history of primary hypertension that has been grossly uncontrolled for quite a while.  She has been taking lisinopril  5 mg tablets spaced out through the day over several hours when her blood pressure remains elevated.  She has been seen at the emergency room for hypertensive emergency and most recently spent the night in the hospital and attempt to get her blood pressure under control.  While at the hospital, she was given hydralazine IV which seem to be well-tolerated and was helpful in getting her blood pressure down.  She has tried multiple medications in the past and each 1 was stopped for a side effect or concern regarding the type of medication.  She has a history of developing a cough with lisinopril -hydrochlorothiazide  however she is tolerating lisinopril  alone fine.  On arrival today her blood pressure is 206/112 and she reports that she is feeling horrible.  She is fatigued and moving very slowly with ambulation.  She is alert and oriented x 3.  Her husband to serve as historian.  They are at a point where they know they must consider an addition to the lisinopril  since her blood pressure remains grossly uncontrolled.  After discussion, she has tried amlodipine  but this was discontinued because of intolerance.  Considered other options but since she did well with hydralazine while in the hospital, we will plan to incorporate that into her daily regimen.  Since her husband is certain that she cannot tolerate larger doses of lisinopril  at the same time, we will plan to administer 10 mg of lisinopril  3 times daily to equal 30 mg daily.  Adding hydralazine 25 mg 3 times daily.  In addition, stress seems to be playing a role and she does get quite anxious at times.  Adding hydroxyzine 10 mg 3 times daily as needed.  Monitor blood  pressures at home with a goal of 130/80 or less.  Follow a low-sodium diet.  Work on getting regular intentional activity on most days of the week.  If concerning symptoms develop, proceed to the emergency room as soon as possible.  At this time, she does not have any chest pain, shortness of breath, dizziness, headaches, lower extremity edema, or palpitations.  Cardiopulmonary exam is normal.  Plan to follow-up in 2 weeks for nurse visit for blood pressure check.  Requested them to bring their logs so we can see what home readings are looking like. - lisinopril  (ZESTRIL ) 10 MG tablet; Take 1 tablet (10 mg total) by mouth in the morning, at noon, and at bedtime.  Dispense: 270 tablet; Refill: 3   Procedures performed this visit: None.  Return in about 2 weeks (around 05/01/2024) for nurse visit for BP check.  __________________________________ Zada FREDRIK Palin, DNP, APRN, FNP-BC Primary Care and Sports Medicine [facility] [place]

## 2024-04-24 DIAGNOSIS — E785 Hyperlipidemia, unspecified: Secondary | ICD-10-CM | POA: Diagnosis not present

## 2024-04-24 DIAGNOSIS — R7303 Prediabetes: Secondary | ICD-10-CM | POA: Diagnosis not present

## 2024-04-24 DIAGNOSIS — Z0000 Encounter for general adult medical examination without abnormal findings: Secondary | ICD-10-CM | POA: Diagnosis not present

## 2024-04-24 DIAGNOSIS — Z79899 Other long term (current) drug therapy: Secondary | ICD-10-CM | POA: Diagnosis not present

## 2024-04-24 DIAGNOSIS — E063 Autoimmune thyroiditis: Secondary | ICD-10-CM | POA: Diagnosis not present

## 2024-04-24 DIAGNOSIS — I10 Essential (primary) hypertension: Secondary | ICD-10-CM | POA: Diagnosis not present

## 2024-04-25 ENCOUNTER — Ambulatory Visit: Payer: Self-pay

## 2024-04-25 NOTE — Telephone Encounter (Signed)
 FYI Only or Action Required?: FYI only for provider.  Patient was last seen in primary care on 04/17/2024 by Nina Mini, NP.  Called Nurse Triage reporting Pain.  Symptoms began several weeks ago.  Interventions attempted: Nothing.  Symptoms are: gradually worsening.  Triage Disposition: See PCP When Office is Open (Within 3 Days)  Patient/caregiver understands and will follow disposition?: Yes     Copied from CRM [id number]. Topic: Clinical - Red Word Triage >> Apr 25, 2024 10:20 AM Carrielelia G wrote: [facility] that prompted transfer to Nurse Triage: mouth, rash, arm rash, sores, diarrhea, stomach pain Reason for Disposition  Mild widespread rash  (Exception: Heat rash lasting 3 days or less.)  [1] Few streaks of blood in vomit AND [2] occurred one time  Answer Assessment - Initial Assessment Questions 1. LOCATION: Where does it hurt?      Abd pain 2. RADIATION: Does the pain shoot anywhere else? (e.g., chest, back)     na 3. ONSET: When did the pain begin? (e.g., minutes, hours or days ago)      X 1.5  week 4. SUDDEN: Gradual or sudden onset?     gradual 5. PATTERN Does the pain come and go, or is it constant?     constant 6. SEVERITY: How bad is the pain?  (e.g., Scale 1-10; mild, moderate, or severe)     mild 7. RECURRENT SYMPTOM: Have you ever had this type of stomach pain before? If Yes, ask: When was the last time? and What happened that time?      no 8. CAUSE: What do you think is causing the stomach pain? (e.g., gallstones, recent abdominal surgery)     unknown 9. RELIEVING/AGGRAVATING FACTORS: What makes it better or worse? (e.g., antacids, bending or twisting motion, bowel movement)     no 10. OTHER SYMPTOMS: Do you have any other symptoms? (e.g., back pain, diarrhea, fever, urination pain, vomiting)       Diarrhea  11. PREGNANCY: Is there any chance you are pregnant? When was your last menstrual period?       na  Answer Assessment  - Initial Assessment Questions 1. APPEARANCE of RASH: What does the rash look like? (e.g., blisters, dry flaky skin, red spots, redness, sores)     Blisters-fluid filled, redness,  2. SIZE: How big are the spots? (e.g., tip of pen, eraser, coin; inches, centimeters)     various 3. LOCATION: Where is the rash located?     Mouth, arms 4. COLOR: What color is the rash? (Note: It is difficult to assess rash color in people with darker-colored skin. When this situation occurs, simply ask the caller to describe what they see.)     na 5. ONSET: When did the rash begin?     X month 6. FEVER: Do you have a fever? If Yes, ask: What is your temperature, how was it measured, and when did it start?     no 7. ITCHING: Does the rash itch? If Yes, ask: How bad is the itch? (Scale 1-10; or mild, moderate, severe)     no 8. CAUSE: What do you think is causing the rash?     unknown 9. MEDICINE FACTORS: Have you started any new medicines within the last 2 weeks? (e.g., antibiotics)      na 10. OTHER SYMPTOMS: Do you have any other symptoms? (e.g., dizziness, headache, sore throat, joint pain)       Headache, 11. PREGNANCY: Is there any chance you are  pregnant? When was your last menstrual period?       na  Protocols used: Abdominal Pain - Female-A-AH, Rash or Redness - [facility]

## 2024-04-30 ENCOUNTER — Ambulatory Visit (INDEPENDENT_AMBULATORY_CARE_PROVIDER_SITE_OTHER): Admitting: Medical-Surgical

## 2024-04-30 ENCOUNTER — Encounter: Payer: Self-pay | Admitting: Medical-Surgical

## 2024-04-30 VITALS — BP 179/97 | HR 95 | Resp 20 | Ht 67.0 in | Wt 186.0 lb

## 2024-04-30 DIAGNOSIS — R198 Other specified symptoms and signs involving the digestive system and abdomen: Secondary | ICD-10-CM

## 2024-04-30 DIAGNOSIS — L270 Generalized skin eruption due to drugs and medicaments taken internally: Secondary | ICD-10-CM

## 2024-04-30 DIAGNOSIS — E063 Autoimmune thyroiditis: Secondary | ICD-10-CM

## 2024-04-30 DIAGNOSIS — I10 Essential (primary) hypertension: Secondary | ICD-10-CM | POA: Diagnosis not present

## 2024-04-30 MED ORDER — SYNTHROID 25 MCG PO TABS: 37.5000 ug | ORAL_TABLET | Freq: Every day | ORAL | 0 refills | Status: AC

## 2024-04-30 MED ORDER — HYDRALAZINE HCL 25 MG PO TABS
25.0000 mg | ORAL_TABLET | Freq: Three times a day (TID) | ORAL | 0 refills | Status: AC
Start: 2024-04-30 — End: ?

## 2024-04-30 NOTE — Progress Notes (Signed)
 Established patient visit  Discussed the use of AI scribe software for clinical note transcription with the patient, who gave verbal consent to proceed.  History of Present Illness   Nina Kirby is an 78 year old female with hypertension and hypothyroidism who presents with medication-related side effects and uncontrolled blood pressure.  Medication-related adverse effects - Rash, abdominal pain, and reaction on arms and shoulders associated with change in levothyroxine  formulation - New manufacturer formula now breaks up into a powder in her mouth, unlike previous version - Stomach symptoms and mouth discomfort improved after stopping levothyroxine  for a few days; symptoms recurred upon resuming medication - Significant stomach discomfort with lisinopril  use at prescribed doses - History of side effects with multiple antihypertensive medications: losartan, metoprolol , and lisinopril  with hydrochlorothiazide , including jitteriness, mental status changes, and jaw pain - Armour Thyroid  previously caused emotional changes and worsening tremors  Hypertension and blood pressure variability - Blood pressure remains uncontrolled with frequent spikes - Heart rate elevated, reaching approximately 90 bpm - Takes lisinopril  in incremental doses throughout the day for hypertension, sometimes up to six doses based on blood pressure readings - History of trying multiple antihypertensive agents due to side effects  Gastrointestinal symptoms - Abdominal pain and significant stomach discomfort associated with medication - Follows a GERD diet to manage gastrointestinal symptoms  Hypothyroid symptoms - Persistent symptoms of hypothyroidism, including feeling cold despite adequate room temperature        Physical Exam Vitals reviewed.  Constitutional:      General: She is not in acute distress.    Appearance: Normal appearance. She is well-developed. She is obese. She is ill-appearing.   HENT:     Head: Normocephalic and atraumatic.  Cardiovascular:     Rate and Rhythm: Normal rate and regular rhythm.     Pulses: Normal pulses.     Heart sounds: Normal heart sounds. No murmur heard.    No friction rub. No gallop.  Pulmonary:     Effort: Pulmonary effort is normal. No respiratory distress.     Breath sounds: Normal breath sounds. No wheezing.  Skin:    General: Skin is warm and dry.  Neurological:     Mental Status: She is alert and oriented to person, place, and time.  Psychiatric:        Mood and Affect: Mood normal.        Behavior: Behavior normal.        Thought Content: Thought content normal.        Judgment: Judgment normal.     Assessment and Plan    Hypertension Hypertension uncontrolled with lisinopril  due to gastrointestinal distress. Previous antihypertensives caused adverse effects. Hydralazine  effective in hospital. Discussed potential advanced management if current strategies fail. - Prescribe hydralazine  25 mg three times a day as previous prescription was not filled at the pharmacy. - Monitor blood pressure response to hydralazine . - Consider referral to advanced hypertension management if blood pressure remains uncontrolled. Patient and husband hesitant.  Hypothyroidism Management complicated by gastrointestinal side effects from generic levothyroxine . Discussed using brand name Synthroid  and potential need for adjunctive therapy like Cytomel  if symptoms persist despite normal lab values. - Prescribe brand name Synthroid  37.5mg  daily. - Recheck thyroid  function tests in six weeks. - Consider Cytomel  if symptoms persist despite normal thyroid  function tests.  Rash Rash suspected to be related to medication changes or brace materials.  Follow-up - Cancel the blood pressure check appointment for tomorrow. - Schedule  follow-up appointment in two weeks to assess response to new medications.      __________________________________ Zada FREDRIK Palin, DNP, APRN, FNP-BC Primary Care and Sports Medicine [facility] [place]

## 2024-05-01 ENCOUNTER — Ambulatory Visit

## 2024-05-03 ENCOUNTER — Other Ambulatory Visit: Payer: Self-pay | Admitting: Medical-Surgical

## 2024-05-03 DIAGNOSIS — I10 Essential (primary) hypertension: Secondary | ICD-10-CM

## 2024-05-09 ENCOUNTER — Telehealth: Payer: Self-pay

## 2024-05-09 NOTE — Telephone Encounter (Signed)
 Attempted call to patient. Left a voice mail message requesting a return call. Gave direct phone number for return call.

## 2024-05-09 NOTE — Telephone Encounter (Signed)
 Copied from CRM #[id number]. Topic: Clinical - Medication Question >> May 09, 2024 11:06 AM Farrel B wrote: Reason for CRM: [id number], patient called to have a question answered in regards to her appt on the 31st of July. Patient stated that the appt was a 2wk follow up to see how the medication is working for her however, the patient stated she didn't get the medications until a week later, and would like to know should she keep the appt or reschedule to make sure its 2 weeks from the time she started the medications.

## 2024-05-09 NOTE — Telephone Encounter (Signed)
 Please change her appt so that she is on the med for a full two weeks.  Thanks, Zada

## 2024-05-13 NOTE — Telephone Encounter (Signed)
 Again called listed home # and left a voice mail message requesting a return call to assist in rescheduling appt.

## 2024-05-14 NOTE — Telephone Encounter (Signed)
 Patient appt moved to May 28, 2024.

## 2024-05-15 ENCOUNTER — Ambulatory Visit: Admitting: Medical-Surgical

## 2024-05-28 ENCOUNTER — Other Ambulatory Visit: Payer: Self-pay

## 2024-05-28 ENCOUNTER — Encounter: Payer: Self-pay | Admitting: Medical-Surgical

## 2024-05-28 ENCOUNTER — Ambulatory Visit: Admitting: Medical-Surgical

## 2024-05-28 VITALS — BP 183/80 | HR 73 | Resp 20 | Ht 67.0 in | Wt 189.1 lb

## 2024-05-28 DIAGNOSIS — I10 Essential (primary) hypertension: Secondary | ICD-10-CM

## 2024-05-28 MED ORDER — SPIRONOLACTONE 25 MG PO TABS: 12.5000 mg | ORAL_TABLET | Freq: Every day | ORAL | 0 refills | Status: DC

## 2024-05-28 MED ORDER — SPIRONOLACTONE 25 MG PO TABS: 12.5000 mg | ORAL_TABLET | Freq: Every day | ORAL | 0 refills | Status: AC

## 2024-05-28 NOTE — Progress Notes (Signed)
 Established patient visit   History of Present Illness   Discussed the use of AI scribe software for clinical note transcription with the patient, who gave verbal consent to proceed.  History of Present Illness   Nina Kirby is an 78 year old female with hypertension who presents for blood pressure management. She is accompanied by her husband who helps serve as historian.   Home blood pressure readings are around 145/75 mmHg, showing improvement. Blood pressure spikes are managed with hydralazine  and lisinopril , both prescribed three times daily. Hydralazine  significantly lowers her diastolic pressure from 100 to 60 mmHg. Lisinopril  causes fluctuations, leading her to adjust the dosage based on readings. Her husband reports that she has only taken 1 dose of the hydralazine  and they are using it as needed at this time for severe blood pressure spikes in order to prevent return to the ED.  The lisinopril  does help with blood pressure however by itself, the fluctuations were too unsteady. On their own, started an OTC diuretic typically used in menstruating women to help with bloating and water retention. The package states to only use 4-5 days then consult your provider. She expresses interest in trying a diuretic to stabilize her blood pressure. She previously tried hydrochlorothiazide , which caused mental status changes and jaw pain, and has not tried other prescription diuretics. There is no current swelling, and she denies chest pain and shortness of breath.        Physical Exam   Physical Exam Vitals reviewed.  Constitutional:      General: She is not in acute distress.    Appearance: Normal appearance. She is obese. She is not ill-appearing.  HENT:     Head: Normocephalic and atraumatic.  Cardiovascular:     Rate and Rhythm: Normal rate and regular rhythm.     Pulses: Normal pulses.     Heart sounds: Normal heart sounds. No murmur heard.    No friction rub. No gallop.   Pulmonary:     Effort: Pulmonary effort is normal. No respiratory distress.     Breath sounds: Normal breath sounds. No wheezing.  Skin:    General: Skin is warm and dry.     Coloration: Skin is pale.  Neurological:     Mental Status: She is alert and oriented to person, place, and time.  Psychiatric:        Mood and Affect: Mood normal.        Behavior: Behavior normal.        Thought Content: Thought content normal.        Judgment: Judgment normal.    Assessment & Plan   Assessment and Plan    Uncontrolled Hypertension with medication intolerance and polypharmacy Hypertension managed with hydralazine , lisinopril , and the OTC diuretic. Home readings 145/75 mmHg. Hydralazine  is a last resort and they prefer to only use it PRN. Lisinopril  causes fluctuations. Previous intolerance to hydrochlorothiazide . Spironolactone  considered for potassium-sparing effects, with hyperkalemia risk. - Prescribed spironolactone  12.5 mg once daily. - Monitor blood pressure at home. - Use hydralazine  as needed for severe hypertension. - Stop current diuretic while starting spironolactone . - Check kidney function and electrolytes in 2-3 weeks after starting spironolactone . - Contact provider if spironolactone  is not tolerated to review other options.      Follow up   Return in about 3 months (around 08/28/2024), or if symptoms worsen or fail to improve. Declined return in 2 weeks for BP check.   __________________________________ Zada  FREDRIK Palin, DNP, APRN, FNP-BC Primary Care and Sports Medicine [facility]

## 2024-05-28 NOTE — Progress Notes (Signed)
 Patient requesting 90 day supply.

## 2024-06-17 ENCOUNTER — Encounter: Payer: Self-pay | Admitting: Sports Medicine

## 2024-06-18 ENCOUNTER — Ambulatory Visit (INDEPENDENT_AMBULATORY_CARE_PROVIDER_SITE_OTHER): Admitting: Medical-Surgical

## 2024-06-18 ENCOUNTER — Encounter: Payer: Self-pay | Admitting: Medical-Surgical

## 2024-06-18 VITALS — BP 180/91 | HR 90 | Resp 20 | Ht 67.0 in | Wt 191.0 lb

## 2024-06-18 DIAGNOSIS — I10 Essential (primary) hypertension: Secondary | ICD-10-CM | POA: Diagnosis not present

## 2024-06-18 DIAGNOSIS — R350 Frequency of micturition: Secondary | ICD-10-CM

## 2024-06-18 DIAGNOSIS — L989 Disorder of the skin and subcutaneous tissue, unspecified: Secondary | ICD-10-CM

## 2024-06-18 DIAGNOSIS — I680 Cerebral amyloid angiopathy: Secondary | ICD-10-CM

## 2024-06-18 DIAGNOSIS — E854 Organ-limited amyloidosis: Secondary | ICD-10-CM

## 2024-06-18 LAB — POCT URINALYSIS DIP (CLINITEK)
Bilirubin, UA: NEGATIVE
Blood, UA: NEGATIVE
Glucose, UA: NEGATIVE mg/dL
Ketones, POC UA: NEGATIVE mg/dL
Leukocytes, UA: NEGATIVE
Nitrite, UA: NEGATIVE
POC PROTEIN,UA: NEGATIVE
Spec Grav, UA: 1.025 (ref 1.010–1.025)
Urobilinogen, UA: 0.2 U/dL
pH, UA: 5.5 (ref 5.0–8.0)

## 2024-06-18 NOTE — Addendum Note (Signed)
 Addended by: FANNY NIELS CROME on: 06/18/2024 01:15 PM   Modules accepted: Orders

## 2024-06-18 NOTE — Progress Notes (Signed)
        Established patient visit   History of Present Illness   Discussed the use of AI scribe software for clinical note transcription with the patient, who gave verbal consent to proceed.  History of Present Illness   Nina Kirby is an 78 year old female with hypertension who presents for blood pressure management and skin concerns.  Hypertension - Home blood pressure readings average 140-150s/70s mmHg - Currently taking lisinopril  5 mg once daily - Previously started on spironolactone  but discontinued after one week due to frequent urination - No dietary changes - Blood pressure readings have not increased despite medication change - Has hydralazine  at home but only uses it for severely high BP readings  Cutaneous lesions - Lesions began on hands and spread to arms and shoulders - Lesions described as hardened pimples - No associated pruritus - Manages lesions by manual expression and application of tannic acid - Family history of similar skin issues in her sister       Physical Exam   Physical Exam Vitals reviewed.  Constitutional:      General: She is not in acute distress.    Appearance: Normal appearance.  HENT:     Head: Normocephalic and atraumatic.  Cardiovascular:     Rate and Rhythm: Normal rate and regular rhythm.     Pulses: Normal pulses.     Heart sounds: Normal heart sounds. No murmur heard.    No friction rub. No gallop.  Pulmonary:     Effort: Pulmonary effort is normal. No respiratory distress.     Breath sounds: Normal breath sounds. No wheezing.  Skin:    General: Skin is warm and dry.     Findings: Rash present.  Neurological:     Mental Status: She is alert and oriented to person, place, and time.  Psychiatric:        Mood and Affect: Mood normal.        Behavior: Behavior normal.        Thought Content: Thought content normal.        Judgment: Judgment normal.      Assessment & Plan   Assessment and Plan    Essential  hypertension Blood pressure improved at home but still elevated in office. Currently on lisinopril  5 mg daily. Stopped spironolactone  due to excessive urination.  - Continue lisinopril  as prescribed. - Recheck kidney function.  Chronic skin lesions of unclear etiology Chronic lesions on arms and shoulders, no itching, potential infection risk from picking.  - Check white blood cell count and differential. - Check inflammatory markers. - Advise against picking at lesions. - Declined referral to dermatology.  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 - Reviewed cognitive testing options. - Referral to Neurology declined.      Follow up   Return in about 6 months (around 12/16/2024) for HTN/thyroid  follow up. __________________________________ Zada FREDRIK Palin, DNP, APRN, FNP-BC Primary Care and Sports Medicine [facility] [employer]

## 2024-06-19 ENCOUNTER — Ambulatory Visit: Payer: Self-pay | Admitting: Medical-Surgical

## 2024-06-19 LAB — CBC WITH DIFFERENTIAL/PLATELET
Basophils Absolute: 0 x10E3/uL (ref 0.0–0.2)
Basos: 0 %
EOS (ABSOLUTE): 0.1 x10E3/uL (ref 0.0–0.4)
Eos: 1 %
Hematocrit: 34.6 % (ref 34.0–46.6)
Hemoglobin: 11.3 g/dL (ref 11.1–15.9)
Immature Grans (Abs): 0 x10E3/uL (ref 0.0–0.1)
Immature Granulocytes: 0 %
Lymphocytes Absolute: 1.2 x10E3/uL (ref 0.7–3.1)
Lymphs: 26 %
MCH: 31.5 pg (ref 26.6–33.0)
MCHC: 32.7 g/dL (ref 31.5–35.7)
MCV: 96 fL (ref 79–97)
Monocytes Absolute: 0.5 x10E3/uL (ref 0.1–0.9)
Monocytes: 11 %
Neutrophils Absolute: 2.8 x10E3/uL (ref 1.4–7.0)
Neutrophils: 62 %
Platelets: 218 x10E3/uL (ref 150–450)
RBC: 3.59 x10E6/uL — ABNORMAL LOW (ref 3.77–5.28)
RDW: 13.4 % (ref 11.7–15.4)
WBC: 4.5 x10E3/uL (ref 3.4–10.8)

## 2024-06-19 LAB — BASIC METABOLIC PANEL WITH GFR
BUN/Creatinine Ratio: 20 (ref 12–28)
BUN: 21 mg/dL (ref 8–27)
CO2: 19 mmol/L — ABNORMAL LOW (ref 20–29)
Calcium: 9.7 mg/dL (ref 8.7–10.3)
Chloride: 108 mmol/L — ABNORMAL HIGH (ref 96–106)
Creatinine, Ser: 1.04 mg/dL — ABNORMAL HIGH (ref 0.57–1.00)
Glucose: 121 mg/dL — ABNORMAL HIGH (ref 70–99)
Potassium: 4.2 mmol/L (ref 3.5–5.2)
Sodium: 141 mmol/L (ref 134–144)
eGFR: 54 mL/min/1.73 — ABNORMAL LOW (ref >59)

## 2024-06-19 LAB — SEDIMENTATION RATE: Sed Rate: 16 mm/h (ref 0–40)

## 2024-06-19 LAB — C-REACTIVE PROTEIN: CRP: <1 mg/L (ref 0–10)

## 2024-07-14 DIAGNOSIS — E7800 Pure hypercholesterolemia, unspecified: Secondary | ICD-10-CM | POA: Diagnosis not present

## 2024-07-14 DIAGNOSIS — E538 Deficiency of other specified B group vitamins: Secondary | ICD-10-CM | POA: Diagnosis not present

## 2024-07-14 DIAGNOSIS — I10 Essential (primary) hypertension: Secondary | ICD-10-CM | POA: Diagnosis not present

## 2024-07-14 DIAGNOSIS — E854 Organ-limited amyloidosis: Secondary | ICD-10-CM | POA: Diagnosis not present

## 2024-07-14 DIAGNOSIS — R739 Hyperglycemia, unspecified: Secondary | ICD-10-CM | POA: Diagnosis not present

## 2024-07-14 DIAGNOSIS — Z79899 Other long term (current) drug therapy: Secondary | ICD-10-CM | POA: Diagnosis not present

## 2024-07-14 DIAGNOSIS — I680 Cerebral amyloid angiopathy: Secondary | ICD-10-CM | POA: Diagnosis not present

## 2024-07-14 DIAGNOSIS — E063 Autoimmune thyroiditis: Secondary | ICD-10-CM | POA: Diagnosis not present

## 2024-07-14 DIAGNOSIS — I160 Hypertensive urgency: Secondary | ICD-10-CM | POA: Diagnosis not present

## 2024-07-14 DIAGNOSIS — R7303 Prediabetes: Secondary | ICD-10-CM | POA: Diagnosis not present

## 2025-03-17 ENCOUNTER — Ambulatory Visit
